# Patient Record
Sex: Female | Born: 1990 | Race: White | Hispanic: No | Marital: Single | Smoking: Former smoker
Health system: Southern US, Community
[De-identification: ages and names within clinical notes are randomized; demographics above are authoritative.]

## PROBLEM LIST (undated history)

## (undated) ENCOUNTER — Inpatient Hospital Stay (HOSPITAL_COMMUNITY): Payer: Self-pay

## (undated) DIAGNOSIS — O364XX0 Maternal care for intrauterine death, not applicable or unspecified: Secondary | ICD-10-CM

## (undated) DIAGNOSIS — K219 Gastro-esophageal reflux disease without esophagitis: Secondary | ICD-10-CM

## (undated) DIAGNOSIS — R519 Headache, unspecified: Secondary | ICD-10-CM

## (undated) DIAGNOSIS — O021 Missed abortion: Secondary | ICD-10-CM

## (undated) DIAGNOSIS — R51 Headache: Secondary | ICD-10-CM

## (undated) HISTORY — PX: DILATION AND CURETTAGE OF UTERUS: SHX78

## (undated) HISTORY — PX: OTHER SURGICAL HISTORY: SHX169

## (undated) HISTORY — PX: WISDOM TOOTH EXTRACTION: SHX21

---

## 2016-08-21 ENCOUNTER — Ambulatory Visit (INDEPENDENT_AMBULATORY_CARE_PROVIDER_SITE_OTHER): Payer: Medicaid Other | Admitting: Family Medicine

## 2016-08-21 ENCOUNTER — Encounter: Payer: Self-pay | Admitting: Family Medicine

## 2016-08-21 ENCOUNTER — Other Ambulatory Visit: Payer: Self-pay | Admitting: Family Medicine

## 2016-08-21 ENCOUNTER — Other Ambulatory Visit (HOSPITAL_COMMUNITY)
Admission: RE | Admit: 2016-08-21 | Discharge: 2016-08-21 | Disposition: A | Discharge status: Home / Self Care | Payer: Medicaid Other | Source: Ambulatory Visit | Attending: Family Medicine | Admitting: Family Medicine

## 2016-08-21 DIAGNOSIS — Z1151 Encounter for screening for human papillomavirus (HPV): Secondary | ICD-10-CM

## 2016-08-21 DIAGNOSIS — Z3491 Encounter for supervision of normal pregnancy, unspecified, first trimester: Secondary | ICD-10-CM | POA: Diagnosis not present

## 2016-08-21 DIAGNOSIS — Z3400 Encounter for supervision of normal first pregnancy, unspecified trimester: Secondary | ICD-10-CM

## 2016-08-21 DIAGNOSIS — Z124 Encounter for screening for malignant neoplasm of cervix: Secondary | ICD-10-CM

## 2016-08-21 DIAGNOSIS — Z3689 Encounter for other specified antenatal screening: Secondary | ICD-10-CM

## 2016-08-21 DIAGNOSIS — Z113 Encounter for screening for infections with a predominantly sexual mode of transmission: Secondary | ICD-10-CM | POA: Diagnosis not present

## 2016-08-21 NOTE — Progress Notes (Signed)
Pt informed that the ultrasound is considered a limited OB ultrasound and is not intended to be a complete ultrasound exam. Patient also informed that the ultrasound is not being completed with the intent of assessing for fetal or placental anomalies or any pelvic abnormalities. Explained that the purpose of today's ultrasound is to assess for viability. Patient acknowledges the purpose of the exam and the limitations of the study.   CRL measures 1.14 cm (7.0 weeks) which is consist with LMP dating. EDC: 04-06-17. Fetal heart rate 160 bpm. Armandina StammerJennifer Danila Eddie RNBSN

## 2016-08-21 NOTE — Patient Instructions (Signed)
    RE: MyChart  Dear Ms. Addison LankRoush  We are excited to introduce MyChart, a new best-in-class service that provides you online access to important information in your electronic medical record. We want to make it easier for you to view your health information - all in one secure location - when and where you need it. We expect MyChart will enhance the quality of care and service we provide. Use the activation code below to enroll in MyChart online at [email or web address]  When you register for MyChart, you can:  Marland Kitchen. View your test results. . Communicate securely with your physician's office.  . View your medical history, allergies, medications, and immunizations. . Conveniently print information such as your medication lists.  If you are age 24 or older and want a member of your family to have access to your record, you must provide written consent by completing a proxy form available at our facility. Please speak to our clinical staff about guidelines regarding accounts for patients younger than age 24.  As you activate your MyChart account and need any technical assistance, please call the MyChart technical support line at [phone]) or email your question to [email or web address]. If you email your question(s), please include your name, a return phone number and the best time to reach you.  Thank you for using MyChart as your new health and wellness resource!  MyChart Activation Code:  [email or web address] Expires: 10/20/2016  3:13 PM           [facility]  [address] [place], [place] [address]

## 2016-08-21 NOTE — Progress Notes (Signed)
  Subjective:    Melanie Harmon is a G4P0030 812w3d being seen today for her first obstetrical visit.  Her obstetrical history is significant for obesity. Also has history of 18 week loss - went into spontaneous labor and abrupted. [facility]. Patient does intend to breast feed. Pregnancy history fully reviewed.  Patient reports no complaints.  Vitals:   08/21/16 1411 08/21/16 1423  BP: 128/73   Pulse: 89   Weight: 278 lb (126.1 kg)   Height:  5\' 8"  (1.727 m)    HISTORY: OB History  Gravida Para Term Preterm AB Living  4 0     3    SAB TAB Ectopic Multiple Live Births  2 1          # Outcome Date GA Lbr Len/2nd Weight Sex Delivery Anes PTL Lv  4 Current           3 SAB           2 SAB           1 TAB              History reviewed. No pertinent past medical history. Past Surgical History:  Procedure Laterality Date  . Dilation and Curretage    . WISDOM TOOTH EXTRACTION     Family History  Problem Relation Age of Onset  . Heart attack Maternal Grandmother   . Heart attack Maternal Grandfather      Exam    Uterus:     Pelvic Exam:    Perineum: No Hemorrhoids, Normal Perineum   Vulva: normal, Bartholin's, Urethra, Skene's normal   Vagina:  normal mucosa   Cervix: no cervical motion tenderness and no lesions   Adnexa: normal adnexa   Bony Pelvis: gynecoid  System:     Skin: normal coloration and turgor, no rashes    Neurologic: gait normal; reflexes normal and symmetric   Extremities: normal strength, tone, and muscle mass   HEENT PERRLA and extra ocular movement intact   Mouth/Teeth mucous membranes moist, pharynx normal without lesions   Neck supple and no masses   Cardiovascular: regular rate and rhythm, no murmurs or gallops   Respiratory:  appears well, vitals normal, no respiratory distress, acyanotic, normal RR, ear and throat exam is normal, neck free of mass or lymphadenopathy, chest clear, no wheezing, crepitations, rhonchi, normal  symmetric air entry   Abdomen: soft, non-tender; bowel sounds normal; no masses,  no organomegaly   Urinary: urethral meatus normal      Assessment:    Pregnancy: G4P0030 There are no active problems to display for this patient.       Plan:     Initial labs drawn. Prenatal vitamins. Problem list reviewed and updated. Genetic Screening discussed First Screen: ordered.  Ultrasound discussed; fetal survey: requested.  Follow up in 4 weeks. 100% of 30 min visit spent on counseling and coordination of care.    Candelaria CelesteSTINSON, Miah Boye JEHIEL 08/21/2016

## 2016-08-22 LAB — HIV ANTIBODY (ROUTINE TESTING W REFLEX): HIV: NONREACTIVE

## 2016-08-23 LAB — OBSTETRIC PANEL
ANTIBODY SCREEN: NEGATIVE
BASOS PCT: 0 %
Basophils Absolute: 0 cells/uL (ref 0–200)
Eosinophils Absolute: 81 cells/uL (ref 15–500)
Eosinophils Relative: 1 %
HCT: 41.5 % (ref 35.0–45.0)
HEMOGLOBIN: 14 g/dL (ref 11.7–15.5)
Hepatitis B Surface Ag: NEGATIVE
LYMPHS ABS: 1863 {cells}/uL (ref 850–3900)
LYMPHS PCT: 23 %
MCH: 29.7 pg (ref 27.0–33.0)
MCHC: 33.7 g/dL (ref 32.0–36.0)
MCV: 87.9 fL (ref 80.0–100.0)
MONOS PCT: 7 %
MPV: 10.1 fL (ref 7.5–12.5)
Monocytes Absolute: 567 cells/uL (ref 200–950)
NEUTROS ABS: 5589 {cells}/uL (ref 1500–7800)
NEUTROS PCT: 69 %
PLATELETS: 251 10*3/uL (ref 140–400)
RBC: 4.72 MIL/uL (ref 3.80–5.10)
RDW: 14.7 % (ref 11.0–15.0)
RUBELLA: 2.92 {index} — AB (ref <0.90)
Rh Type: POSITIVE
WBC: 8.1 10*3/uL (ref 3.8–10.8)

## 2016-08-23 LAB — URINE CULTURE: Organism ID, Bacteria: NO GROWTH

## 2016-08-26 LAB — PAIN MGMT, OPIATES EXP. QN, UR
Codeine: NEGATIVE ng/mL (ref <50)
HYDROCODONE: NEGATIVE ng/mL (ref <50)
HYDROMORPHONE: NEGATIVE ng/mL (ref <50)
Morphine: NEGATIVE ng/mL (ref <50)
NORHYDROCODONE: NEGATIVE ng/mL (ref <50)
NOROXYCODONE: NEGATIVE ng/mL (ref <50)
OXYCODONE: NEGATIVE ng/mL (ref <50)
Oxymorphone: NEGATIVE ng/mL (ref <50)

## 2016-08-28 LAB — GC/CHLAMYDIA PROBE AMP (~~LOC~~) NOT AT ARMC
Chlamydia: NEGATIVE
NEISSERIA GONORRHEA: NEGATIVE

## 2016-09-11 ENCOUNTER — Encounter (HOSPITAL_COMMUNITY): Payer: Self-pay | Admitting: Family Medicine

## 2016-09-18 ENCOUNTER — Ambulatory Visit (INDEPENDENT_AMBULATORY_CARE_PROVIDER_SITE_OTHER): Payer: Medicaid Other | Admitting: Family Medicine

## 2016-09-18 VITALS — BP 112/62 | HR 97 | Wt 281.0 lb

## 2016-09-18 DIAGNOSIS — Z3400 Encounter for supervision of normal first pregnancy, unspecified trimester: Secondary | ICD-10-CM

## 2016-09-18 MED ORDER — PANTOPRAZOLE SODIUM 40 MG PO TBEC: 40.0000 mg | DELAYED_RELEASE_TABLET | Freq: Every day | ORAL | 3 refills | Status: DC

## 2016-09-18 MED ORDER — PANTOPRAZOLE SODIUM 20 MG PO TBEC: 40.0000 mg | DELAYED_RELEASE_TABLET | Freq: Every day | ORAL | Status: DC

## 2016-09-18 NOTE — Addendum Note (Signed)
Addended by: Levie HeritageSTINSON, Kylinn Shropshire J on: 09/18/2016 04:39 PM   Modules accepted: Orders

## 2016-09-18 NOTE — Progress Notes (Signed)
   PRENATAL VISIT NOTE  Subjective:  Melanie Harmon is a 25 y.o. G4P0030 at 5055w3d being seen today for ongoing prenatal care.  She is currently monitored for the following issues for this low-risk pregnancy and has Supervision of normal first pregnancy, antepartum on her problem list.  Patient reports heartburn - improved slightly with Zantac and Tums, but not completely  Contractions: Not present. Vag. Bleeding: None.   . Denies leaking of fluid.   The following portions of the patient's history were reviewed and updated as appropriate: allergies, current medications, past family history, past medical history, past social history, past surgical history and problem list. Problem list updated.  Objective:   Vitals:   09/18/16 1353  BP: 112/62  Pulse: 97  Weight: 281 lb (127.5 kg)    Fetal Status: Fetal Heart Rate (bpm): 150         General:  Alert, oriented and cooperative. Patient is in no acute distress.  Skin: Skin is warm and dry. No rash noted.   Cardiovascular: Normal heart rate noted  Respiratory: Normal respiratory effort, no problems with respiration noted  Abdomen: Soft, gravid, appropriate for gestational age. Pain/Pressure: Absent     Pelvic:  Cervical exam deferred        Extremities: Normal range of motion.  Edema: None  Mental Status: Normal mood and affect. Normal behavior. Normal judgment and thought content.   Assessment and Plan:  Pregnancy: G4P0030 at 5855w3d  1. Supervision of normal first pregnancy, antepartum FHT normal. Protonix for heartburn  Preterm labor symptoms and general obstetric precautions including but not limited to vaginal bleeding, contractions, leaking of fluid and fetal movement were reviewed in detail with the patient. Please refer to After Visit Summary for other counseling recommendations.  No Follow-up on file.   Levie HeritageJacob J Reighlynn Swiney, DO

## 2016-09-24 ENCOUNTER — Inpatient Hospital Stay (HOSPITAL_COMMUNITY): Payer: Medicaid Other

## 2016-09-24 ENCOUNTER — Inpatient Hospital Stay (HOSPITAL_COMMUNITY)
Admission: AD | Admit: 2016-09-24 | Discharge: 2016-09-24 | Disposition: A | Discharge status: Home / Self Care | Payer: Medicaid Other | Source: Ambulatory Visit | Attending: Family Medicine | Admitting: Family Medicine

## 2016-09-24 ENCOUNTER — Encounter (HOSPITAL_COMMUNITY): Payer: Self-pay | Admitting: *Deleted

## 2016-09-24 DIAGNOSIS — O468X1 Other antepartum hemorrhage, first trimester: Secondary | ICD-10-CM

## 2016-09-24 DIAGNOSIS — Z3A12 12 weeks gestation of pregnancy: Secondary | ICD-10-CM | POA: Insufficient documentation

## 2016-09-24 DIAGNOSIS — Z87891 Personal history of nicotine dependence: Secondary | ICD-10-CM | POA: Insufficient documentation

## 2016-09-24 DIAGNOSIS — O208 Other hemorrhage in early pregnancy: Secondary | ICD-10-CM | POA: Insufficient documentation

## 2016-09-24 DIAGNOSIS — O418X10 Other specified disorders of amniotic fluid and membranes, first trimester, not applicable or unspecified: Secondary | ICD-10-CM

## 2016-09-24 DIAGNOSIS — O209 Hemorrhage in early pregnancy, unspecified: Secondary | ICD-10-CM

## 2016-09-24 DIAGNOSIS — N939 Abnormal uterine and vaginal bleeding, unspecified: Secondary | ICD-10-CM | POA: Diagnosis present

## 2016-09-24 LAB — URINALYSIS, ROUTINE W REFLEX MICROSCOPIC
Bilirubin Urine: NEGATIVE
GLUCOSE, UA: NEGATIVE mg/dL
KETONES UR: NEGATIVE mg/dL
Leukocytes, UA: NEGATIVE
NITRITE: NEGATIVE
PROTEIN: NEGATIVE mg/dL
Specific Gravity, Urine: 1.014 (ref 1.005–1.030)
pH: 6 (ref 5.0–8.0)

## 2016-09-24 NOTE — MAU Provider Note (Signed)
History     CSN: [id number]  Arrival date and time: 09/24/16 1145   First Provider Initiated Contact with Patient 09/24/16 1225      Chief Complaint  Patient presents with  . Vaginal Bleeding   HPI Melanie Harmon is a 25 y.o. G4P0030 at 1230w2d who presents with vaginal bleeding. Reports passing a large amount of blood and some clots this morning. Has continued to bleed since then but not as heavy. Reports lower abdominal cramping that she rates 2/10. Has not treated pain. Denies n/v/d, constipation, fever/chills. Had intercourse last night.   OB History    Gravida Para Term Preterm AB Living   4 0     3     SAB TAB Ectopic Multiple Live Births   2 1            Past Medical History:  Diagnosis Date  . Medical history non-contributory     Past Surgical History:  Procedure Laterality Date  . Dilation and Curretage    . WISDOM TOOTH EXTRACTION      Family History  Problem Relation Age of Onset  . Heart attack Maternal Grandmother   . Heart attack Maternal Grandfather     Social History  Substance Use Topics  . Smoking status: Former Games developermoker  . Smokeless tobacco: Former NeurosurgeonUser  . Alcohol use No    Allergies: No Known Allergies  Prescriptions Prior to Admission  Medication Sig Dispense Refill Last Dose  . pantoprazole (PROTONIX) 40 MG tablet Take 1 tablet (40 mg total) by mouth daily. 30 tablet 3 09/23/2016 at Unknown time  . Prenatal Vit-Fe Fumarate-FA (MULTIVITAMIN-PRENATAL) 27-0.8 MG TABS tablet Take 1 tablet by mouth daily at 12 noon.   09/23/2016 at Unknown time    Review of Systems  Constitutional: Negative.   Gastrointestinal: Positive for abdominal pain. Negative for constipation, diarrhea, nausea and vomiting.  Genitourinary:       + vaginal bleeding   Physical Exam   Blood pressure 132/87, pulse 101, temperature 98.2 F (36.8 C), temperature source Oral, resp. rate 18, height 5\' 8"  (1.727 m), weight 281 lb (127.5 kg), last menstrual period  06/30/2016.  Physical Exam  Nursing note and vitals reviewed. Constitutional: She is oriented to person, place, and time. She appears well-developed and well-nourished. No distress.  HENT:  Head: Normocephalic and atraumatic.  Eyes: Conjunctivae are normal. Right eye exhibits no discharge. Left eye exhibits no discharge. No scleral icterus.  Neck: Normal range of motion.  Cardiovascular: Normal rate, regular rhythm and normal heart sounds.   No murmur heard. Respiratory: Effort normal and breath sounds normal. No respiratory distress. She has no wheezes.  GI: Soft. Bowel sounds are normal. She exhibits no distension. There is no tenderness.  Genitourinary: Cervix exhibits no motion tenderness and no friability. There is bleeding (small amount of dark red blood; no active bleeding) in the vagina.  Genitourinary Comments: Cervix closed  Neurological: She is alert and oriented to person, place, and time.  Skin: Skin is warm and dry. She is not diaphoretic.  Psychiatric: She has a normal mood and affect. Her behavior is normal. Judgment and thought content normal.    MAU Course  Procedures Results for orders placed or performed during the hospital encounter of 09/24/16 (from the past 24 hour(s))  Urinalysis, Routine w reflex microscopic     Status: Abnormal   Collection Time: 09/24/16 12:02 PM  Result Value Ref Range   Color, Urine YELLOW YELLOW   APPearance CLEAR CLEAR  Specific Gravity, Urine 1.014 1.005 - 1.030   pH 6.0 5.0 - 8.0   Glucose, UA NEGATIVE NEGATIVE mg/dL   Hgb urine dipstick MODERATE (A) NEGATIVE   Bilirubin Urine NEGATIVE NEGATIVE   Ketones, ur NEGATIVE NEGATIVE mg/dL   Protein, ur NEGATIVE NEGATIVE mg/dL   Nitrite NEGATIVE NEGATIVE   Leukocytes, UA NEGATIVE NEGATIVE   RBC / HPF 0-5 0 - 5 RBC/hpf   WBC, UA 0-5 0 - 5 WBC/hpf   Bacteria, UA RARE (A) NONE SEEN   Squamous Epithelial / LPF 0-5 (A) NONE SEEN   Mucous PRESENT    [place] Ob Comp Less 14 Wks  Result Date:  09/24/2016 CLINICAL DATA:  Heavy vaginal bleeding beginning this morning. Gestational age by LMP of twelve weeks 2 days. EXAM: OBSTETRIC <14 WK ULTRASOUND TECHNIQUE: Transabdominal ultrasound was performed for evaluation of the gestation as well as the maternal uterus and adnexal regions. COMPARISON:  None. FINDINGS: Intrauterine gestational sac: Single Yolk sac:  Not Visualized. Embryo:  Visualized. Cardiac Activity: Visualized. Heart Rate: 152 bpm CRL:   63  mm   12 w 4 d                  US EDC: 04/04/2017 Subchorionic hemorrhage:  Small subchorionic hemorrhage noted. Maternal uterus/adnexae: Both ovaries are normal in appearance. No mass or free fluid identified. IMPRESSION: Single living IUP with estimated gestational age of [redacted] weeks 4 days. This is concordant with LMP. Small subchorionic hemorrhage noted. Electronically Signed   By: Myles RosenthalJohn  Stahl M.D.   On: 09/24/2016 13:15    MDM Rh positive Ultrasound shows SIUP with cardiac activity & small SCH  Assessment and Plan  A: 1. Subchorionic hematoma in first trimester, single or unspecified fetus   2. Vaginal bleeding in pregnancy, first trimester    P: Discharge home Pelvic rest F/u for scheduled appts Discussed reasons to return to  MAU  Judeth HornErin Marylynn Rigdon 09/24/2016, 12:25 PM

## 2016-09-24 NOTE — MAU Note (Signed)
Pt states she woke up & thought she had to go to the BR, found bleeding, was in her underwear & down her legs.  This was @ approximately 1000.  Pt unsure if she is still bleeding, took a shower & came straight to MAU.  Denies pain.

## 2016-09-24 NOTE — Discharge Instructions (Signed)
Subchorionic Hematoma A subchorionic hematoma is a gathering of blood between the outer wall of the placenta and the inner wall of the womb (uterus). The placenta is the organ that connects the fetus to the wall of the uterus. The placenta performs the feeding, breathing (oxygen to the fetus), and waste removal (excretory work) of the fetus.  Subchorionic hematoma is the most common abnormality found on a result from ultrasonography done during the first trimester or early second trimester of pregnancy. If there has been little or no vaginal bleeding, early small hematomas usually shrink on their own and do not affect your baby or pregnancy. The blood is gradually absorbed over 1-2 weeks. When bleeding starts later in pregnancy or the hematoma is larger or occurs in an older pregnant woman, the outcome may not be as good. Larger hematomas may get bigger, which increases the chances for miscarriage. Subchorionic hematoma also increases the risk of premature detachment of the placenta from the uterus, preterm (premature) labor, and stillbirth. HOME CARE INSTRUCTIONS  Stay on bed rest if your health care provider recommends this. Although bed rest will not prevent more bleeding or prevent a miscarriage, your health care provider may recommend bed rest until you are advised otherwise.  Avoid heavy lifting (more than 10 lb [4.5 kg]), exercise, sexual intercourse, or douching as directed by your health care provider.  Keep track of the number of pads you use each day and how soaked (saturated) they are. Write down this information.  Do not use tampons.  Keep all follow-up appointments as directed by your health care provider. Your health care provider may ask you to have follow-up blood tests or ultrasound tests or both. SEEK IMMEDIATE MEDICAL CARE IF:  You have severe cramps in your stomach, back, abdomen, or pelvis.  You have a fever.  You pass large clots or tissue. Save any tissue for your health  care provider to look at.  Your bleeding increases or you become lightheaded, feel weak, or have fainting episodes. This information is not intended to replace advice given to you by your health care provider. Make sure you discuss any questions you have with your health care provider. Document Released: 01/01/2007 Document Revised: 10/07/2014 Document Reviewed: 04/15/2013 Elsevier Interactive Patient Education  2017 Elsevier Inc.         Pelvic Rest Introduction Pelvic rest may be recommended if:  Your placenta is partially or completely covering the opening of your cervix (placenta previa).  There is bleeding between the wall of the uterus and the amniotic sac in the first trimester of pregnancy (subchorionic hemorrhage).  You went into labor too early (preterm labor). Based on your overall health and the health of your baby, your health care provider will decide if pelvic rest is right for you. How do I rest my pelvis? For as long as told by your health care provider:  Do not have sex, sexual stimulation, or an orgasm.  Do not use tampons. Do not douche. Do not put anything in your vagina.  Do not lift anything that is heavier than 10 lb (4.5 kg).  Avoid activities that take a lot of effort (are strenuous).  Avoid any activity in which your pelvic muscles could become strained. When should I seek medical care? Seek medical care if you have:  Cramping pain in your lower abdomen.  Vaginal discharge.  A low, dull backache.  Regular contractions.  Uterine tightening. When should I seek immediate medical care? Seek immediate medical care if:  You have vaginal bleeding and you are pregnant. This information is not intended to replace advice given to you by your health care provider. Make sure you discuss any questions you have with your health care provider. Document Released: 01/11/2011 Document Revised: 02/22/2016 Document Reviewed: 03/20/2015  2017 Elsevier

## 2016-09-26 ENCOUNTER — Other Ambulatory Visit: Payer: Self-pay | Admitting: Family Medicine

## 2016-09-26 ENCOUNTER — Other Ambulatory Visit (HOSPITAL_COMMUNITY): Payer: Self-pay | Admitting: *Deleted

## 2016-09-26 ENCOUNTER — Ambulatory Visit (HOSPITAL_COMMUNITY)
Admission: RE | Admit: 2016-09-26 | Discharge: 2016-09-26 | Disposition: A | Discharge status: Home / Self Care | Payer: Medicaid Other | Source: Ambulatory Visit | Attending: Family Medicine | Admitting: Family Medicine

## 2016-09-26 ENCOUNTER — Encounter (HOSPITAL_COMMUNITY): Payer: Self-pay

## 2016-09-26 DIAGNOSIS — Z3A12 12 weeks gestation of pregnancy: Secondary | ICD-10-CM | POA: Insufficient documentation

## 2016-09-26 DIAGNOSIS — O2622 Pregnancy care for patient with recurrent pregnancy loss, second trimester: Secondary | ICD-10-CM | POA: Diagnosis not present

## 2016-09-26 DIAGNOSIS — O2620 Pregnancy care for patient with recurrent pregnancy loss, unspecified trimester: Secondary | ICD-10-CM

## 2016-09-26 DIAGNOSIS — Z3400 Encounter for supervision of normal first pregnancy, unspecified trimester: Secondary | ICD-10-CM

## 2016-09-26 DIAGNOSIS — Z3682 Encounter for antenatal screening for nuchal translucency: Secondary | ICD-10-CM

## 2016-09-26 DIAGNOSIS — Z3689 Encounter for other specified antenatal screening: Secondary | ICD-10-CM

## 2016-09-26 DIAGNOSIS — O99211 Obesity complicating pregnancy, first trimester: Secondary | ICD-10-CM | POA: Diagnosis not present

## 2016-09-30 NOTE — L&D Delivery Note (Signed)
Obstetrical Delivery Note   Date of Delivery:   11/29/2016 Primary OB:   [facility] Gestational Age/EDD: 1937w5d Antepartum complications: Patient admitted for preterm labor vs cervical insufficiency, BMI 43, history of 18wk delivery  Delivered By:   Dilkon Bingharlie Dirck Butch, Montez HagemanJr.  Delivery Type:   spontaneous vaginal delivery  Delivery Details:   CTSP and patient had delivered placenta and pregnancy (en caul) prior to receiving any medication for augmentation. Newborn still alive and sac ruptured (brown fluid) and infant cord clamped and cut and handed to parents. Placenta inspected and looked grossly normal, non malodorous. Anesthesia:    IV Intrapartum complications: None Laceration:    none Episiotomy:    none Rectal exam:   deferred Placenta:    Delivered spontaneously. Intact: yes. To pathology: yes.  Estimated Blood Loss:  150mL  Baby:    Liveborn female.  Entry from later in the day: weight 400gm. 25.5cm in length. No gross anomalies noted on exam, patent anus.   Cornelia Copaharlie Jacquline Terrill, Jr. MD Attending [facility](Faculty Practice)

## 2016-10-01 ENCOUNTER — Other Ambulatory Visit: Payer: Self-pay | Admitting: Family Medicine

## 2016-10-16 ENCOUNTER — Encounter: Payer: Medicaid Other | Admitting: Family Medicine

## 2016-10-18 ENCOUNTER — Telehealth: Payer: Self-pay

## 2016-10-18 NOTE — Telephone Encounter (Signed)
Attempted patient phone number and disconnected/wrong number. My chart message sent about rescheduled appointment. Armandina StammerJennifer Ruthann Angulo RNBSN

## 2016-10-23 ENCOUNTER — Ambulatory Visit (INDEPENDENT_AMBULATORY_CARE_PROVIDER_SITE_OTHER): Payer: Medicaid Other | Admitting: Family Medicine

## 2016-10-23 VITALS — BP 103/66 | HR 102 | Wt 286.0 lb

## 2016-10-23 DIAGNOSIS — M9901 Segmental and somatic dysfunction of cervical region: Secondary | ICD-10-CM | POA: Diagnosis not present

## 2016-10-23 DIAGNOSIS — O9989 Other specified diseases and conditions complicating pregnancy, childbirth and the puerperium: Secondary | ICD-10-CM

## 2016-10-23 DIAGNOSIS — M9900 Segmental and somatic dysfunction of head region: Secondary | ICD-10-CM

## 2016-10-23 DIAGNOSIS — G44209 Tension-type headache, unspecified, not intractable: Secondary | ICD-10-CM | POA: Diagnosis not present

## 2016-10-23 DIAGNOSIS — M9908 Segmental and somatic dysfunction of rib cage: Secondary | ICD-10-CM

## 2016-10-23 DIAGNOSIS — S0990XS Unspecified injury of head, sequela: Secondary | ICD-10-CM | POA: Insufficient documentation

## 2016-10-23 DIAGNOSIS — Z3402 Encounter for supervision of normal first pregnancy, second trimester: Secondary | ICD-10-CM

## 2016-10-23 DIAGNOSIS — M9902 Segmental and somatic dysfunction of thoracic region: Secondary | ICD-10-CM

## 2016-10-23 DIAGNOSIS — G44309 Post-traumatic headache, unspecified, not intractable: Secondary | ICD-10-CM

## 2016-10-23 DIAGNOSIS — Z3400 Encounter for supervision of normal first pregnancy, unspecified trimester: Secondary | ICD-10-CM

## 2016-10-23 NOTE — Progress Notes (Signed)
Pt is experiencing intense headaches every other day

## 2016-10-23 NOTE — Progress Notes (Signed)
   PRENATAL VISIT NOTE  Subjective:  Melanie Harmon is a 26 y.o. G4P0030 at 2868w3d being seen today for ongoing prenatal care.  She is currently monitored for the following issues for this low-risk pregnancy and has Supervision of normal first pregnancy, antepartum on her problem list.  Patient reports headache - every other day. Tension - band like distribution, lasts most of the day.  Contractions: Not present. Vag. Bleeding: None.  Movement: Present. Denies leaking of fluid.   The following portions of the patient's history were reviewed and updated as appropriate: allergies, current medications, past family history, past medical history, past social history, past surgical history and problem list. Problem list updated.  Objective:   Vitals:   10/23/16 1323  BP: 103/66  Pulse: (!) 102  Weight: 286 lb (129.7 kg)    Fetal Status: Fetal Heart Rate (bpm): 148   Movement: Present     General:  Alert, oriented and cooperative. Patient is in no acute distress.  Skin: Skin is warm and dry. No rash noted.   Cardiovascular: Normal heart rate noted  Respiratory: Normal respiratory effort, no problems with respiration noted  Abdomen: Soft, gravid, appropriate for gestational age. Pain/Pressure: Absent     Pelvic:  Cervical exam deferred        MSK: Restriction, tenderness, tissue texture changes, and paraspinal spasm in the cervical spine  Neuro: Moves all four extremities with no focal neurological deficit  Extremities: Normal range of motion.  Edema: None  Mental Status: Normal mood and affect. Normal behavior. Normal judgment and thought content.   OSE: Head OA SRRL  Cervical C3 FSRL  Thoracic T4-5  FSRR  Rib T4-5 inhaled right  Lumbar   Sacrum   Pelvis     Assessment and Plan:  Pregnancy: G4P0030 at 8068w3d  1. Supervision of normal first pregnancy, antepartum FH and FHT normal  2. Tension headache Flexeril prescribed. 3. Somatic dysfunction of head region 4. Somatic  dysfunction of cervical region 5. Somatic dysfunction of thoracic region 6. Somatic dysfunction of rib cage region OMT done after pt permission. Tolerated well. HVLA, ME utilized.    Preterm labor symptoms and general obstetric precautions including but not limited to vaginal bleeding, contractions, leaking of fluid and fetal movement were reviewed in detail with the patient. Please refer to After Visit Summary for other counseling recommendations.  No Follow-up on file.  Levie HeritageJacob J Stinson, DO

## 2016-11-13 ENCOUNTER — Ambulatory Visit (HOSPITAL_COMMUNITY)
Admission: RE | Admit: 2016-11-13 | Discharge: 2016-11-13 | Disposition: A | Discharge status: Home / Self Care | Payer: Medicaid Other | Source: Ambulatory Visit | Attending: Family Medicine | Admitting: Family Medicine

## 2016-11-13 DIAGNOSIS — O09292 Supervision of pregnancy with other poor reproductive or obstetric history, second trimester: Secondary | ICD-10-CM | POA: Diagnosis not present

## 2016-11-13 DIAGNOSIS — Z3A19 19 weeks gestation of pregnancy: Secondary | ICD-10-CM | POA: Insufficient documentation

## 2016-11-13 DIAGNOSIS — Z363 Encounter for antenatal screening for malformations: Secondary | ICD-10-CM | POA: Diagnosis not present

## 2016-11-13 DIAGNOSIS — O99212 Obesity complicating pregnancy, second trimester: Secondary | ICD-10-CM | POA: Diagnosis not present

## 2016-11-13 DIAGNOSIS — Z3689 Encounter for other specified antenatal screening: Secondary | ICD-10-CM

## 2016-11-18 ENCOUNTER — Encounter: Payer: Medicaid Other | Admitting: Family Medicine

## 2016-11-25 ENCOUNTER — Inpatient Hospital Stay (HOSPITAL_COMMUNITY): Payer: Medicaid Other

## 2016-11-25 ENCOUNTER — Encounter (HOSPITAL_COMMUNITY): Payer: Self-pay

## 2016-11-25 ENCOUNTER — Inpatient Hospital Stay (HOSPITAL_COMMUNITY)
Admission: AD | Admit: 2016-11-25 | Discharge: 2016-11-29 | DRG: 775 | Disposition: A | Discharge status: Home / Self Care | Payer: Medicaid Other | Source: Ambulatory Visit | Attending: Obstetrics and Gynecology | Admitting: Obstetrics and Gynecology

## 2016-11-25 DIAGNOSIS — Z87891 Personal history of nicotine dependence: Secondary | ICD-10-CM | POA: Diagnosis not present

## 2016-11-25 DIAGNOSIS — O3432 Maternal care for cervical incompetence, second trimester: Secondary | ICD-10-CM | POA: Diagnosis present

## 2016-11-25 DIAGNOSIS — O42112 Preterm premature rupture of membranes, onset of labor more than 24 hours following rupture, second trimester: Secondary | ICD-10-CM | POA: Diagnosis not present

## 2016-11-25 DIAGNOSIS — O42912 Preterm premature rupture of membranes, unspecified as to length of time between rupture and onset of labor, second trimester: Secondary | ICD-10-CM | POA: Diagnosis not present

## 2016-11-25 DIAGNOSIS — O4690 Antepartum hemorrhage, unspecified, unspecified trimester: Secondary | ICD-10-CM

## 2016-11-25 DIAGNOSIS — Z3A21 21 weeks gestation of pregnancy: Secondary | ICD-10-CM | POA: Diagnosis not present

## 2016-11-25 DIAGNOSIS — O09299 Supervision of pregnancy with other poor reproductive or obstetric history, unspecified trimester: Secondary | ICD-10-CM | POA: Diagnosis present

## 2016-11-25 LAB — TYPE AND SCREEN
ABO/RH(D): O POS
Antibody Screen: NEGATIVE

## 2016-11-25 MED ORDER — DOCUSATE SODIUM 100 MG PO CAPS
100.0000 mg | ORAL_CAPSULE | Freq: Every day | ORAL | Status: DC
  Administered 2016-11-26 – 2016-11-28 (×3): 100 mg via ORAL
  Filled 2016-11-25 (×5): qty 1

## 2016-11-25 MED ORDER — ACETAMINOPHEN 325 MG PO TABS
650.0000 mg | ORAL_TABLET | ORAL | Status: DC | PRN
  Administered 2016-11-25 – 2016-11-29 (×5): 650 mg via ORAL
  Filled 2016-11-25 (×5): qty 2

## 2016-11-25 MED ORDER — CALCIUM CARBONATE ANTACID 500 MG PO CHEW: 2.0000 | CHEWABLE_TABLET | ORAL | Status: DC | PRN

## 2016-11-25 MED ORDER — PRENATAL MULTIVITAMIN CH
1.0000 | ORAL_TABLET | Freq: Every day | ORAL | Status: DC
  Administered 2016-11-26 – 2016-11-28 (×3): 1 via ORAL
  Filled 2016-11-25 (×4): qty 1

## 2016-11-25 NOTE — H&P (Signed)
Melanie Harmon is a 26 y.o. female presenting for Feeling something bulging.  .First Provider Initiated Contact with Patient 11/25/16 2103     HPI: Melanie Harmon is a 26 y.o. G4P0030 at 3w1dwho presents to maternity admissions reporting pressure in pelvis, felt like something was bulging.  States has had to urinate frequently today.  Upon entering room, started having vaginal bleeding.. She reports good fetal movement, denies LOF, vaginal itching/burning, urinary symptoms, h/a, dizziness, n/v, diarrhea, constipation or fever/chills.    History is remarkable for an 18 week abruption and loss (pt states was 21 weeks but told Dr Adrian Blackwater it was 18 weeks).  Delivered at [facility].   Patient states Dr Shawnie Pons told her it was an abruption, and the other doctor told her it was incompetent cervix.  States went to ED, sat in waiting room x 5 hrs, then was found to be 6cm.   RN checked her and she ruptured and delivered.  States each doctor told her different diagnosis and no one talked about cerclage.   Records from office reviewed, there is no mention of presentation for delivery.  Hospital records are not included, thus unable to review.  Vaginal Bleeding  The patient's primary symptoms include vaginal bleeding. The patient's pertinent negatives include no genital itching, genital lesions, genital odor or pelvic pain. This is a new problem. The current episode started today. The problem occurs constantly. The problem has been gradually worsening. The patient is experiencing no pain. She is pregnant. Associated symptoms include frequency. Pertinent negatives include no abdominal pain, back pain, chills, constipation, diarrhea, dysuria, fever, flank pain, nausea or vomiting. The vaginal discharge was bloody. The vaginal bleeding is heavier than menses. She has not been passing clots. She has not been passing tissue. She has tried nothing for the symptoms.   RN Note: Pt presents stating she feels like something is coming out  of her vagina. States she went to the bathroom and wiped and states she felt something there. Denies bleeding or leaking. Denies pain.   OB History    Gravida Para Term Preterm AB Living   4 0     3 0   SAB TAB Ectopic Multiple Live Births   2 1           Past Medical History:  Diagnosis Date  . Medical history non-contributory    Past Surgical History:  Procedure Laterality Date  . DILATION AND CURETTAGE OF UTERUS    . Dilation and Curretage    . WISDOM TOOTH EXTRACTION     Family History: family history includes Heart attack in her maternal grandfather and maternal grandmother. Social History:  reports that she has quit smoking. She has quit using smokeless tobacco. She reports that she does not drink alcohol or use drugs.     Maternal Diabetes: No Genetic Screening: Normal Maternal Ultrasounds/Referrals: Normal Fetal Ultrasounds or other Referrals:  None Maternal Substance Abuse:  No Significant Maternal Medications:  None Significant Maternal Lab Results:  None Other Comments:  Blood type O+  Review of Systems  Constitutional: Negative for chills, fever and malaise/fatigue.  Respiratory: Negative for shortness of breath.   Cardiovascular: Negative for leg swelling.  Gastrointestinal: Negative for abdominal pain, constipation, diarrhea, nausea and vomiting.  Genitourinary: Positive for frequency. Negative for dysuria.  Musculoskeletal: Negative for back pain and neck pain.  Neurological: Negative for dizziness and focal weakness.   Maternal Medical History:  Reason for admission: Nausea. Incompetent cervix Bulging membranes  Contractions: Onset was 1-2  hours ago.   Frequency: rare.    Fetal activity: Perceived fetal activity is normal.   Last perceived fetal movement was within the past hour.    Prenatal complications: Bleeding.   No PIH, placental abnormality, pre-eclampsia or preterm labor.   Prenatal Complications - Diabetes: none.      Blood  pressure 117/66, pulse (!) 122, temperature 98.9 F (37.2 C), temperature source Oral, resp. rate 20, height 5\' 8"  (1.727 m), weight 286 lb (129.7 kg), last menstrual period 06/30/2016, SpO2 99 %. Maternal Exam:  Uterine Assessment: Contraction frequency is rare.   Abdomen: Patient reports no abdominal tenderness. Fundal height is 21.   Fetal presentation: no presenting part  Introitus: Normal vulva. Normal vagina.  Ferning test: negative.  Nitrazine test: not done.  Cervix: Cervix evaluated by sterile speculum exam.     Fetal Exam Fetal Monitor Review: Mode: ultrasound.   Baseline rate: 150s.      Physical Exam  Constitutional: She is oriented to person, place, and time. She appears well-developed and well-nourished. No distress.  HENT:  Head: Normocephalic.  Cardiovascular: Normal rate and regular rhythm.   Respiratory: Effort normal and breath sounds normal. No respiratory distress.  GI: Soft. She exhibits no distension. There is no tenderness. There is no rebound and no guarding.  Genitourinary:  Genitourinary Comments: Sterile speculum exam Vagina filled with blood Membranes visible at external os which appears to be about 5cm dilated and fully effaced Negative ferning  Musculoskeletal: Normal range of motion.  Neurological: She is alert and oriented to person, place, and time.  Skin: Skin is warm and dry.  Psychiatric: She has a normal mood and affect.    Prenatal labs: ABO, Rh: O/POS/-- (11/22 1514) Antibody: NEG (11/22 1514) Rubella: 2.92 (11/22 1514) RPR: NON REAC (11/22 1514)  HBsAg: NEGATIVE (11/22 1514)  HIV: NONREACTIVE (11/22 1514)  GBS:     Assessment/Plan: [facility]UP at 7633w1d Incompetent cervix Bulging membranes, no evidence of PPROM Vaginal bleeding  Consulted Dr Jolayne Pantheronstant Admit to Antenatal for expectant management Discussed situation with parents Discussed we may discharge her home until viability.  MD to follow   Wynelle BourgeoisMarie Sarajean Dessert 11/25/2016, 9:32  PM

## 2016-11-25 NOTE — MAU Note (Signed)
Pt presents stating she feels like something is coming out of her vagina. States she went to the bathroom and wiped and states she felt something there. Denies bleeding or leaking. Denies pain.

## 2016-11-26 DIAGNOSIS — O3432 Maternal care for cervical incompetence, second trimester: Principal | ICD-10-CM

## 2016-11-26 LAB — CBC
HCT: 34.9 % — ABNORMAL LOW (ref 36.0–46.0)
Hemoglobin: 11.9 g/dL — ABNORMAL LOW (ref 12.0–15.0)
MCH: 30.5 pg (ref 26.0–34.0)
MCHC: 34.1 g/dL (ref 30.0–36.0)
MCV: 89.5 fL (ref 78.0–100.0)
PLATELETS: 185 10*3/uL (ref 150–400)
RBC: 3.9 MIL/uL (ref 3.87–5.11)
RDW: 12.9 % (ref 11.5–15.5)
WBC: 11.7 10*3/uL — AB (ref 4.0–10.5)

## 2016-11-26 LAB — ABO/RH: ABO/RH(D): O POS

## 2016-11-26 MED ORDER — INDOMETHACIN 50 MG PO CAPS
50.0000 mg | ORAL_CAPSULE | Freq: Three times a day (TID) | ORAL | Status: AC
  Administered 2016-11-26 – 2016-11-28 (×9): 50 mg via ORAL
  Filled 2016-11-26 (×9): qty 1

## 2016-11-26 MED ORDER — NIFEDIPINE ER OSMOTIC RELEASE 30 MG PO TB24
30.0000 mg | ORAL_TABLET | Freq: Two times a day (BID) | ORAL | Status: DC
  Administered 2016-11-26 – 2016-11-28 (×7): 30 mg via ORAL
  Filled 2016-11-26 (×7): qty 1

## 2016-11-26 MED ORDER — INDOMETHACIN 50 MG PO CAPS: 50.0000 mg | ORAL_CAPSULE | Freq: Three times a day (TID) | ORAL | Status: DC

## 2016-11-26 NOTE — Progress Notes (Signed)
Patient ID: Melanie ShaveZari Dady, female   DOB: 1991-05-07, 26 y.o.   MRN: [id number] FACULTY PRACTICE ANTEPARTUM(COMPREHENSIVE) NOTE  Melanie Harmon is a 26 y.o. G4P0030 at 5869w2d  who is admitted for incompetent cervix, preterm labor with bulging bag.    Fetal presentation is unsure. Length of Stay:  1  Days  Date of admission:11/25/2016  Subjective: Patient reports intermittent contractions Patient reports the fetal movement as active. Patient reports uterine contraction  activity as irregular, every 5-7 minutes. Patient reports  vaginal bleeding as none. Patient describes fluid per vagina as None.  Vitals:  Blood pressure (!) 129/55, pulse 100, temperature 98.7 F (37.1 C), temperature source Oral, resp. rate 20, height 5\' 8"  (1.727 m), weight 275 lb (124.7 kg), last menstrual period 06/30/2016, SpO2 99 %. Vitals:   11/25/16 2054 11/25/16 2254  BP: 117/66 (!) 129/55  Pulse: (!) 122 100  Resp: 20 20  Temp: 98.9 F (37.2 C) 98.7 F (37.1 C)  TempSrc: Oral Oral  SpO2: 99%   Weight: 286 lb (129.7 kg) 275 lb (124.7 kg)  Height: 5\' 8"  (1.727 m) 5\' 8"  (1.727 m)   Physical Examination:  General appearance - alert, well appearing, and in no distress Fundal Height:  size equals dates Pelvic Exam:  Not performed Cervical Exam: not performed Extremities: extremities normal, atraumatic, no cyanosis or edema with DTRs 2+ on the bilateral Membranes:intact  Fetal Monitoring:  Positive heart tones  Labs:  Results for orders placed or performed during the hospital encounter of 11/25/16 (from the past 24 hour(s))  Type and screen [facility]   Collection Time: 11/25/16 10:07 PM  Result Value Ref Range   ABO/RH(D) O POS    Antibody Screen NEG    Sample Expiration 11/28/2016   ABO/Rh   Collection Time: 11/25/16 10:07 PM  Result Value Ref Range   ABO/RH(D) O POS     Imaging Studies:      Medications:  Scheduled . docusate sodium  100 mg Oral Daily  . indomethacin  50 mg Oral  TID WC  . NIFEdipine  30 mg Oral BID  . prenatal multivitamin  1 tablet Oral Q1200   I have reviewed the patient's current medications.  ASSESSMENT: G4P0030 3069w2d Estimated Date of Delivery: 04/06/17  Patient Active Problem List   Diagnosis Date Noted  . Incompetent cervix during second trimester, antepartum 11/25/2016  . Headaches due to old head injury 10/23/2016  . Supervision of normal first pregnancy, antepartum 08/21/2016    PLAN: 1) Continue trendelenburg 2) tocolysis with procardia and now indocin added this am 3) Discussed limited intervention at this gestational age. The goal is to try to get her to stop contracting. Patient and her husband verbalized understanding and all questions were answered 4) Continue current care 5) cbc ordered   Denya Buckingham 11/26/2016,7:47 AM

## 2016-11-26 NOTE — Progress Notes (Signed)
I offered initial support to Melanie Harmon who arrived early this morning.  She experienced a 18 week loss in 2017 and reports that she and her husband have been weary of becoming too attached to this baby because of their fear of losing her.  I normalized this experience that many grieving parents have.  Melanie Harmon became tearful when talking about her baby, Melanie Harmon (not sure of spelling), but then requested that we not have that conversation at this time. I affirmed her feeling of not wanting to talk and let her know of our availability to talk, bring art supplies or other things to help her through this period of waiting.  Chaplain Dyanne CarrelKaty Justyn Langham, Bcc Pager, [phone] 12:38 PM    11/26/16 1200  Clinical Encounter Type  Visited With Patient  Visit Type Initial;Spiritual support  Referral From Nurse  Spiritual Encounters  Spiritual Needs Emotional

## 2016-11-26 NOTE — Progress Notes (Signed)
Notified provider that patient c/o of having a pressure feeling and Patient continues to have a small amount of bleeding. Per provider continue to keep patient in trendelenburg and defer vaginal exam

## 2016-11-27 MED ORDER — PANTOPRAZOLE SODIUM 40 MG PO TBEC
40.0000 mg | DELAYED_RELEASE_TABLET | Freq: Every day | ORAL | Status: DC
  Administered 2016-11-27 – 2016-11-28 (×2): 40 mg via ORAL
  Filled 2016-11-27 (×2): qty 1

## 2016-11-27 NOTE — Progress Notes (Signed)
FACULTY PRACTICE ANTEPARTUM(COMPREHENSIVE) NOTE  Melanie Harmon is a 26 y.o. G4P0030 at 6443w3d by best clinical estimate who is admitted for incompetent cervix.   Fetal presentation is transverse. Length of Stay:  2  Days  Subjective: Feels less pressure Patient reports the fetal movement as active. Patient reports uterine contraction  activity as none. Patient reports  vaginal bleeding as scant staining. Patient describes fluid per vagina as None.  Vitals:  Blood pressure (!) 90/47, pulse 94, temperature 98.6 F (37 C), temperature source Oral, resp. rate 16, height 5\' 8"  (1.727 m), weight 275 lb (124.7 kg), last menstrual period 06/30/2016, SpO2 100 %. Physical Examination:  General appearance - alert, well appearing, and in no distress Chest - normal effort Abdomen - gravid, NT Pelvic - Normal EFG, just inside introitus BBOW palpated. No fetal parts noted. Fundal Height:  size less than dates Extremities: Homans sign is negative, no sign of DVT  Membranes:intact   Medications:  Scheduled . docusate sodium  100 mg Oral Daily  . indomethacin  50 mg Oral TID WC  . NIFEdipine  30 mg Oral BID  . pantoprazole  40 mg Oral Daily  . prenatal multivitamin  1 tablet Oral Q1200   I have reviewed the patient's current medications.  ASSESSMENT: Active Problems:   Incompetent cervix during second trimester, antepartum   PLAN: Plan to continue to monitor x 1 more day then re-assess. She is understanding of limitations of this gestational age. Discussed plan for future pregnancy to include 17 P and cerclage. She is reluctant to go home, because she lives so far away, but understands we may not be able to keep her.  Reva Boresanya S Pratt, MD 11/27/2016,3:17 PM

## 2016-11-28 ENCOUNTER — Encounter: Payer: Medicaid Other | Admitting: Family Medicine

## 2016-11-28 ENCOUNTER — Encounter: Payer: Self-pay | Admitting: Family Medicine

## 2016-11-29 ENCOUNTER — Encounter (HOSPITAL_COMMUNITY): Payer: Self-pay | Admitting: Obstetrics and Gynecology

## 2016-11-29 DIAGNOSIS — O42112 Preterm premature rupture of membranes, onset of labor more than 24 hours following rupture, second trimester: Secondary | ICD-10-CM

## 2016-11-29 LAB — CBC WITH DIFFERENTIAL/PLATELET
BASOS ABS: 0 10*3/uL (ref 0.0–0.1)
BASOS PCT: 0 %
EOS ABS: 0.1 10*3/uL (ref 0.0–0.7)
EOS PCT: 1 %
HCT: 35.1 % — ABNORMAL LOW (ref 36.0–46.0)
Hemoglobin: 12.2 g/dL (ref 12.0–15.0)
Lymphocytes Relative: 16 %
Lymphs Abs: 1.8 10*3/uL (ref 0.7–4.0)
MCH: 30.3 pg (ref 26.0–34.0)
MCHC: 34.8 g/dL (ref 30.0–36.0)
MCV: 87.3 fL (ref 78.0–100.0)
MONO ABS: 0.5 10*3/uL (ref 0.1–1.0)
Monocytes Relative: 5 %
Neutro Abs: 8.4 10*3/uL — ABNORMAL HIGH (ref 1.7–7.7)
Neutrophils Relative %: 78 %
PLATELETS: 213 10*3/uL (ref 150–400)
RBC: 4.02 MIL/uL (ref 3.87–5.11)
RDW: 12.9 % (ref 11.5–15.5)
WBC: 10.8 10*3/uL — ABNORMAL HIGH (ref 4.0–10.5)

## 2016-11-29 LAB — TYPE AND SCREEN
ABO/RH(D): O POS
ANTIBODY SCREEN: NEGATIVE

## 2016-11-29 MED ORDER — OXYTOCIN 40 UNITS IN LACTATED RINGERS INFUSION - SIMPLE MED
INTRAVENOUS | Status: AC
  Filled 2016-11-29: qty 1000

## 2016-11-29 MED ORDER — FENTANYL CITRATE (PF) 100 MCG/2ML IJ SOLN
50.0000 ug | INTRAMUSCULAR | Status: DC | PRN
  Administered 2016-11-29: 50 ug via INTRAVENOUS
  Filled 2016-11-29: qty 2

## 2016-11-29 MED ORDER — ZOLPIDEM TARTRATE 5 MG PO TABS: 5.0000 mg | ORAL_TABLET | Freq: Every evening | ORAL | 1 refills | Status: DC | PRN

## 2016-11-29 MED ORDER — OXYTOCIN 40 UNITS IN LACTATED RINGERS INFUSION - SIMPLE MED: 1.0000 m[IU]/min | INTRAVENOUS | Status: DC

## 2016-11-29 MED ORDER — IBUPROFEN 600 MG PO TABS: 600.0000 mg | ORAL_TABLET | Freq: Four times a day (QID) | ORAL | 0 refills | Status: DC | PRN

## 2016-11-29 MED ORDER — MISOPROSTOL 200 MCG PO TABS: 400.0000 ug | ORAL_TABLET | ORAL | Status: DC

## 2016-11-29 NOTE — Progress Notes (Signed)
Terrah and her husband declined speaking at this time (per Charity fundraiserN).  I offered grief support to pt's mother, Alona BeneJoyce who was visiting from [place].  [place].  I offered listening presence to Alona BeneJoyce and gave her my cards for follow up support to pass on to her children if they want to talk at any point.  Chaplain Dyanne CarrelKaty Kinnley Paulson, Bcc Pager, [phone] 2:20 PM    11/29/16 1400  Clinical Encounter Type  Visited With Family;Health care provider  Visit Type Spiritual support  Spiritual Encounters  Spiritual Needs Grief support  Stress Factors  Patient Stress Factors Loss  Family Stress Factors Loss

## 2016-11-29 NOTE — Progress Notes (Addendum)
GYN Note  Having more pain and pressure and fetal parts (still intact BOW) feels lower and patient very uncomfortable with stable small amount of bleeding/bloody show. They would like to proceed with augmentin for inevitable AB and likely chorio. D/w her that she can have epidural but she'd like to do IV pain meds. After this is given will come by and place cytotec. Okay to stay on 3rd floor if stays stable. D/w them risk of needing d&c for rPOCs. They would like to see the child after delivery.   Cornelia Copaharlie Micaila Ziemba, Jr MD Attending [facility] 11/29/2016 Time: 1043am

## 2016-11-29 NOTE — Progress Notes (Addendum)
GYN Note  CTSP re: increased VB, LOF and pain and pressure.   Gen: in Trendelenberg, appears anxious AF VS normal and stable Abd: NTTP SVE: fetal parts (likely footling) and BOW (intact palpable to +2 to +3). No cervix palpated Blood on EGBUS and approx 25mL on the pad.  Bedside u/s: breech, normal FHR and subjectively normal fluid  O POS CBC Latest Ref Rng & Units 11/29/2016 11/26/2016 08/21/2016  WBC 4.0 - 10.5 K/uL 10.8(H) 11.7(H) 8.1  Hemoglobin 12.0 - 15.0 g/dL 16.112.2 11.9(L) 14.0  Hematocrit 36.0 - 46.0 % 35.1(L) 34.9(L) 41.5  Platelets 150 - 400 K/uL 213 185 251   A/P: likely PTL  D/w pt and family re: next steps and that she definitely has to stay no matter what is decided. Also, d/w them re: fetus is at least two weeks too early to consider fetal intervention and all is for maternal benefit.  I told them that this is consistent with inevitable AB and I recommend stopping the tocolytics (indocin and procardia) and augmenting with medications for delivery, given consistent with inevitable AB and with increased bleeding, risk of more bleeding; also, she has an elevated WBC and left shift and concern for chorio. Other option I told her is to continue with current clinical course and see what happens. Will follow up with them and see what they decide  30min spent on patient care and >50% in face to face discussion with the patient.    Cornelia Copaharlie Torris House, Jr MD [facility]ending [facility] 11/29/2016 Time: [phone]

## 2016-11-29 NOTE — Discharge Instructions (Signed)
Vaginal Delivery, Care After Refer to this sheet in the next few weeks. These discharge instructions provide you with information on caring for yourself after delivery. Your caregiver may also give you specific instructions. Your treatment has been planned according to the most current medical practices available, but problems sometimes occur. Call your caregiver if you have any problems or questions after you go home. HOME CARE INSTRUCTIONS 1. Take over-the-counter or prescription medicines only as directed by your caregiver or pharmacist. 2. Do not drink alcohol, especially if you are breastfeeding or taking medicine to relieve pain. 3. Do not smoke tobacco. 4. Continue to use good perineal care. Good perineal care includes: 1. Wiping your perineum from back to front 2. Keeping your perineum clean. 3. You can do sitz baths twice a day, to help keep this area clean 5. Do not use tampons, douche or have sex until your caregiver says it is okay. 6. Shower only and avoid sitting in submerged water, aside from sitz baths 7. Wear a well-fitting bra that provides breast support. 8. Eat healthy foods. 9. Drink enough fluids to keep your urine clear or pale yellow. 10. Eat high-fiber foods such as whole grain cereals and breads, brown rice, beans, and fresh fruits and vegetables every day. These foods may help prevent or relieve constipation. 11. Avoid constipation with high fiber foods or medications, such as miralax or metamucil 12. Follow your caregiver's recommendations regarding resumption of activities such as climbing stairs, driving, lifting, exercising, or traveling. 13. Talk to your caregiver about resuming sexual activities. Resumption of sexual activities is dependent upon your risk of infection, your rate of healing, and your comfort and desire to resume sexual activity. 14. Try to have someone help you with your household activities and your newborn for at least a few days after you leave  the hospital. 15. Rest as much as possible. Try to rest or take a nap when your newborn is sleeping. 16. Increase your activities gradually. 17. Keep all of your scheduled postpartum appointments. It is very important to keep your scheduled follow-up appointments. At these appointments, your caregiver will be checking to make sure that you are healing physically and emotionally. SEEK MEDICAL CARE IF:   You are passing large clots from your vagina. Save any clots to show your caregiver.  You have a foul smelling discharge from your vagina.  You have trouble urinating.  You are urinating frequently.  You have pain when you urinate.  You have a change in your bowel movements.  You have increasing redness, pain, or swelling near your vaginal incision (episiotomy) or vaginal tear.  You have pus draining from your episiotomy or vaginal tear.  Your episiotomy or vaginal tear is separating.  You have painful, hard, or reddened breasts.  You have a severe headache.  You have blurred vision or see spots.  You feel sad or depressed.  You have thoughts of hurting yourself or your newborn.  You have questions about your care, the care of your newborn, or medicines.  You are dizzy or light-headed.  You have a rash.  You have nausea or vomiting.  You were breastfeeding and have not had a menstrual period within 12 weeks after you stopped breastfeeding.  You are not breastfeeding and have not had a menstrual period by the 12th week after delivery.  You have a fever. SEEK IMMEDIATE MEDICAL CARE IF:   You have persistent pain.  You have chest pain.  You have shortness of breath.    You faint.  You have leg pain.  You have stomach pain.  Your vaginal bleeding saturates two or more sanitary pads in 1 hour. MAKE SURE YOU:   Understand these instructions.  Will watch your condition.  Will get help right away if you are not doing well or get worse. Document Released:  09/13/2000 Document Revised: 01/31/2014 Document Reviewed: 05/13/2012 ExitCare Patient Information 2015 [employer]. This information is not intended to replace advice given to you by your health care provider. Make sure you discuss any questions you have with your health care provider.  Sitz Bath A sitz bath is a warm water bath taken in the sitting position. The water covers only the hips and butt (buttocks). We recommend using one that fits in the toilet, to help with ease of use and cleanliness. It may be used for either healing or cleaning purposes. Sitz baths are also used to relieve pain, itching, or muscle tightening (spasms). The water may contain medicine. Moist heat will help you heal and relax.  HOME CARE  Take 3 to 4 sitz baths a day. 18. Fill the bathtub half-full with warm water. 19. Sit in the water and open the drain a little. 20. Turn on the warm water to keep the tub half-full. Keep the water running constantly. 21. Soak in the water for 15 to 20 minutes. 22. After the sitz bath, pat the affected area dry. GET HELP RIGHT AWAY IF: You get worse instead of better. Stop the sitz baths if you get worse. MAKE SURE YOU:  Understand these instructions.  Will watch your condition.  Will get help right away if you are not doing well or get worse. Document Released: 10/24/2004 Document Revised: 06/10/2012 Document Reviewed: 01/14/2011 ExitCare Patient Information 2015 [employer]. This information is not intended to replace advice given to you by your health care provider. Make sure you discuss any questions you have with your health care provider.    

## 2016-11-29 NOTE — Discharge Summary (Signed)
Discharge Summary   Admit Date: 11/25/2016 Discharge Date: 11/29/2016 Discharging Service: Antepartum  [facility] Admitting Physician: Catalina AntiguaPeggy Constant, MD  [facility]  Primary Care Provider: No primary care provider on file.  Admission Diagnoses: *Pregnancy at 21/1 weeks *Preterm labor vs cervical insufficiency *BMI 43 *History of 18 week delivery  Discharge Diagnoses: *Status post SVD at 21/5 weeks *Preterm labor vs cervical insufficiency *BMI 43 *History of 18 week delivery  Consult Orders: None   Surgeries/Procedures Performed: None  History and Physical: Cosigned by: Catalina AntiguaPeggy Constant, MD at 11/26/2016 1:08 AM  Attestation signed by Catalina AntiguaPeggy Constant, MD at 11/26/2016 1:08 AM  Attestation of Attending Supervision of Advanced Practitioner (CNM/NP): Evaluation and management procedures were performed by the Advanced Practitioner under my supervision and collaboration.  I have reviewed the Advanced Practitioner's note and chart, and I agree with the management and plan.  Peggy Constant 11/26/2016 1:08 AM        [] Hide copied text Melanie Harmon is a 26 y.o. female presenting for Feeling something bulging.  .First Provider Initiated Contact with Patient 11/25/16 2103     HPI: Melanie ShaveZari Bedoya is a 26 y.o. G4P0030 at 5121w1dwho presents to maternity admissions reporting pressure in pelvis, felt like something was bulging.  States has had to urinate frequently today.  Upon entering room, started having vaginal bleeding.. She reports good fetal movement, denies LOF, vaginal itching/burning, urinary symptoms, h/a, dizziness, n/v, diarrhea, constipation or fever/chills.    History is remarkable for an 18 week abruption and loss (pt states was 21 weeks but told Dr Adrian BlackwaterStinson it was 18 weeks).  [facility]   Patient states Dr Shawnie Ponsorn told her it was an abruption, and the other doctor told her it was incompetent cervix.  States  went to ED, sat in waiting room x 5 hrs, then was found to be 6cm.   RN checked her and she ruptured and delivered.  States each doctor told her different diagnosis and no one talked about cerclage.   Records from office reviewed, there is no mention of presentation for delivery.  Hospital records are not included, thus unable to review.  Vaginal Bleeding  The patient's primary symptoms include vaginal bleeding. The patient's pertinent negatives include no genital itching, genital lesions, genital odor or pelvic pain. This is a new problem. The current episode started today. The problem occurs constantly. The problem has been gradually worsening. The patient is experiencing no pain. She is pregnant. Associated symptoms include frequency. Pertinent negatives include no abdominal pain, back pain, chills, constipation, diarrhea, dysuria, fever, flank pain, nausea or vomiting. The vaginal discharge was bloody. The vaginal bleeding is heavier than menses. She has not been passing clots. She has not been passing tissue. She has tried nothing for the symptoms.   RN Note: Pt presents stating she feels like something is coming out of her vagina. States she went to the bathroom and wiped and states she felt something there. Denies bleeding or leaking. Denies pain.           OB History    Gravida Para Term Preterm AB Living   4 0     3 0   SAB TAB Ectopic Multiple Live Births   2 1               Past Medical History:  Diagnosis Date  . Medical history non-contributory         Past Surgical History:  Procedure Laterality Date  .  DILATION AND CURETTAGE OF UTERUS    . Dilation and Curretage    . WISDOM TOOTH EXTRACTION     Family History: family history includes Heart attack in her maternal grandfather and maternal grandmother. Social History:  reports that she has quit smoking. She has quit using smokeless tobacco. She reports that she does not drink alcohol or use  drugs.     Maternal Diabetes: No Genetic Screening: Normal Maternal Ultrasounds/Referrals: Normal Fetal Ultrasounds or other Referrals:  None Maternal Substance Abuse:  No Significant Maternal Medications:  None Significant Maternal Lab Results:  None Other Comments:  Blood type O+  Review of Systems  Constitutional: Negative for chills, fever and malaise/fatigue.  Respiratory: Negative for shortness of breath.   Cardiovascular: Negative for leg swelling.  Gastrointestinal: Negative for abdominal pain, constipation, diarrhea, nausea and vomiting.  Genitourinary: Positive for frequency. Negative for dysuria.  Musculoskeletal: Negative for back pain and neck pain.  Neurological: Negative for dizziness and focal weakness.   Maternal Medical History:  Reason for admission: Nausea. Incompetent cervix Bulging membranes  Contractions: Onset was 1-2 hours ago.   Frequency: rare.    Fetal activity: Perceived fetal activity is normal.   Last perceived fetal movement was within the past hour.    Prenatal complications: Bleeding.   No PIH, placental abnormality, pre-eclampsia or preterm labor.   Prenatal Complications - Diabetes: none.   Blood pressure 117/66, pulse (!) 122, temperature 98.9 F (37.2 C), temperature source Oral, resp. rate 20, height 5\' 8"  (1.727 m), weight 286 lb (129.7 kg), last menstrual period 06/30/2016, SpO2 99 %. Maternal Exam:  Uterine Assessment: Contraction frequency is rare.   Abdomen: Patient reports no abdominal tenderness. Fundal height is 21.   Fetal presentation: no presenting part  Introitus: Normal vulva. Normal vagina.  Ferning test: negative.  Nitrazine test: not done.  Cervix: Cervix evaluated by sterile speculum exam.     Fetal Exam Fetal Monitor Review: Mode: ultrasound.   Baseline rate: 150s.     Physical Exam  Constitutional: She is oriented to person, place, and time. She appears well-developed and  well-nourished. No distress.  HENT:  Head: Normocephalic.  Cardiovascular: Normal rate and regular rhythm.   Respiratory: Effort normal and breath sounds normal. No respiratory distress.  GI: Soft. She exhibits no distension. There is no tenderness. There is no rebound and no guarding.  Genitourinary:  Genitourinary Comments: Sterile speculum exam Vagina filled with blood Membranes visible at external os which appears to be about 5cm dilated and fully effaced Negative ferning  Musculoskeletal: Normal range of motion.  Neurological: She is alert and oriented to person, place, and time.  Skin: Skin is warm and dry.  Psychiatric: She has a normal mood and affect.    Prenatal labs: ABO, Rh: O/POS/-- (11/22 1514) Antibody: NEG (11/22 1514) Rubella: 2.92 (11/22 1514) RPR: NON REAC (11/22 1514)  HBsAg: NEGATIVE (11/22 1514)  HIV: NONREACTIVE (11/22 1514)  GBS:     Assessment/Plan: [facility]UP at [redacted]w[redacted]d Incompetent cervix Bulging membranes, no evidence of PPROM Vaginal bleeding  Consulted Dr Jolayne Panther Admit to Antenatal for expectant management Discussed situation with parents Discussed we may discharge her home until viability.  MD to follow      Wynelle Bourgeois 11/25/2016, 9:32 PM       Electronically signed by Aviva Signs, CNM at 11/25/2016 9:40 PM Electronically signed by Aviva Signs, CNM at 11/25/2016 9:41 PM Electronically signed by Catalina Antigua, MD at 11/26/2016 1:08 [facility]  Course: Patient was managed with tocolysis with indocin and procardia XL during her hospitalization, as well as trendelenburg. On the day of delivery, the situation for inevitable AB, based on exam, was discussed with her and her husband and recommendation for augmentation, which they were amenable to. Prior to this taking place, she started to have more abdominal pain and VB and subsequently delivered the fetus and placenta spontaneously.  They declined autopsy, fetus was grossly  normal, female, weighed 400gm and was 25.5cm in length (see delivery note for full details). They desired discharged to home on PPD#0. Laboratory work up for early delivery was not done while she was inpatient.   Discharge Exam:   Current Vital Signs 24h Vital Sign Ranges  T 98.9 F (37.2 C) Temp  Avg: 98.6 F (37 C)  Min: 98.1 F (36.7 C)  Max: 98.9 F (37.2 C)  BP 104/64 BP  Min: 99/59  Max: 127/64  HR 96 Pulse  Avg: 103.4  Min: 96  Max: 110  RR 18 Resp  Avg: 17.5  Min: 16  Max: 18  SaO2 96 % Not Delivered SpO2  Avg: 97.5 %  Min: 96 %  Max: 99 %       24 Hour I/O Current Shift I/O  Time Ins Outs No intake/output data recorded. No intake/output data recorded.    General appearance: Well nourished, well developed female in no acute distress.  Cardiovascular: S1, S2 normal, no murmur, rub or gallop, regular rate and rhythm Respiratory:  Clear to auscultation bilateral. Normal respiratory effort Abdomen: obese, positive bowel sounds and no masses, hernias; diffusely non tender to palpation, non distended Neuro/Psych:  Normal mood and affect.  Skin:  Warm and dry.  GU: no gross VB  Discharge Disposition:  Home  Patient Instructions:  standard   Results Pending at Discharge:  Placental pathology  Discharge Medications: Allergies as of 11/29/2016   No Known Allergies     Medication List    TAKE these medications   ibuprofen 600 MG tablet Commonly known as:  ADVIL,MOTRIN Take 1 tablet (600 mg total) by mouth every 6 (six) hours as needed.   multivitamin-prenatal 27-0.8 MG Tabs tablet Take 1 tablet by mouth daily at 12 noon.   pantoprazole 40 MG tablet Commonly known as:  PROTONIX Take 1 tablet (40 mg total) by mouth daily.   zolpidem 5 MG tablet Commonly known as:  AMBIEN Take 1 tablet (5 mg total) by mouth at bedtime as needed for sleep.        No future appointments.  Request sent to clinic for a 7-10d PP visit.   Cornelia Copa MD Attending Center  for [facility])

## 2016-11-29 NOTE — Progress Notes (Signed)
House coverage [facility]. Patient to go to funeral home tomorrow to sign some papers.

## 2016-11-29 NOTE — Progress Notes (Signed)
GYN Note  CTSP and patient had delivered placenta and pregnancy (en caul) prior to receiving any medication for augmentation. Newborn still alive and sac ruptured (brown fluid) and infant cord clamped and cut and handed to parents. Placenta inspected and looked grossly normal, non malodorous; will send placenta to pathology. Pitocin started.  Once parents have had time, will fully inspect newborn, get weight and length but female, appears grossly normal and anus appears patent.  Melanie Harmon, Jr MD Attending [facility] 11/29/2016 Time: 1104am

## 2016-11-29 NOTE — Progress Notes (Signed)
House coverage notified family ready for infant to be taken to morgue.

## 2016-11-29 NOTE — Progress Notes (Signed)
OB Note Patient doing well and no pain or VB, doing well, and would like to go home. Newborn has passed away. They decline autopsy Newborn is 400gm, 25.5cm in length and no gross anomalies noted on detailed exam. inbasket message sent to have patient follow up in [facility] office in 7-10d.    Cornelia Copaharlie Jansen Sciuto, Jr MD Attending [facility] 11/29/2016 Time: [phone]

## 2016-11-29 NOTE — Progress Notes (Signed)
Melanie Harmon and Melanie Harmon had questions about funeral homes (per [employer]) and I provided information.  I alsoAntonieta Harmon gave them information about Heartstrings and other support resources.  I offered a blessing for their [facility], Melanie Harmon[facility] and brought them choices of angel pouches, which they also received when their son Melanie Harmon was born at 518 weeks.  Family is likely to use [facility] and plans to have their baby cremated.  Please page as needs arise over the weekend.  Chaplain Dyanne CarrelKaty Bliss Behnke, Bcc Pager, [phone] 3:29 PM    11/29/16 1500  Clinical Encounter Type  Visited With Patient and family together  Visit Type Spiritual support  Referral From Nurse

## 2016-12-02 ENCOUNTER — Encounter (HOSPITAL_COMMUNITY): Payer: Self-pay | Admitting: *Deleted

## 2016-12-09 ENCOUNTER — Ambulatory Visit: Payer: Medicaid Other | Admitting: Family Medicine

## 2016-12-13 ENCOUNTER — Ambulatory Visit (INDEPENDENT_AMBULATORY_CARE_PROVIDER_SITE_OTHER): Payer: Medicaid Other | Admitting: Family Medicine

## 2016-12-13 ENCOUNTER — Encounter: Payer: Self-pay | Admitting: Family Medicine

## 2016-12-13 VITALS — BP 99/47 | HR 77 | Ht 68.0 in | Wt 285.0 lb

## 2016-12-13 DIAGNOSIS — N883 Incompetence of cervix uteri: Secondary | ICD-10-CM

## 2016-12-13 NOTE — Patient Instructions (Signed)
Etonogestrel implant What is this medicine? ETONOGESTREL (et oh noe JES trel) is a contraceptive (birth control) device. It is used to prevent pregnancy. It can be used for up to 3 years. This medicine may be used for other purposes; ask your health care provider or pharmacist if you have questions. COMMON BRAND NAME(S): Implanon, Nexplanon What should I tell my health care provider before I take this medicine? They need to know if you have any of these conditions: -abnormal vaginal bleeding -blood vessel disease or blood clots -cancer of the breast, cervix, or liver -depression -diabetes -gallbladder disease -headaches -heart disease or recent heart attack -high blood pressure -high cholesterol -kidney disease -liver disease -renal disease -seizures -tobacco smoker -an unusual or allergic reaction to etonogestrel, other hormones, anesthetics or antiseptics, medicines, foods, dyes, or preservatives -pregnant or trying to get pregnant -breast-feeding How should I use this medicine? This device is inserted just under the skin on the inner side of your upper arm by a health care professional. Talk to your pediatrician regarding the use of this medicine in children. Special care may be needed. Overdosage: If you think you have taken too much of this medicine contact a poison control center or emergency room at once. NOTE: This medicine is only for you. Do not share this medicine with others. What if I miss a dose? This does not apply. What may interact with this medicine? Do not take this medicine with any of the following medications: -amprenavir -bosentan -fosamprenavir This medicine may also interact with the following medications: -barbiturate medicines for inducing sleep or treating seizures -certain medicines for fungal infections like ketoconazole and itraconazole -grapefruit juice -griseofulvin -medicines to treat seizures like carbamazepine, felbamate, oxcarbazepine,  phenytoin, topiramate -modafinil -phenylbutazone -rifampin -rufinamide -some medicines to treat HIV infection like atazanavir, indinavir, lopinavir, nelfinavir, tipranavir, ritonavir -St. John's wort This list may not describe all possible interactions. Give your health care provider a list of all the medicines, herbs, non-prescription drugs, or dietary supplements you use. Also tell them if you smoke, drink alcohol, or use illegal drugs. Some items may interact with your medicine. What should I watch for while using this medicine? This product does not protect you against HIV infection (AIDS) or other sexually transmitted diseases. You should be able to feel the implant by pressing your fingertips over the skin where it was inserted. Contact your doctor if you cannot feel the implant, and use a non-hormonal birth control method (such as condoms) until your doctor confirms that the implant is in place. If you feel that the implant may have broken or become bent while in your arm, contact your healthcare provider. What side effects may I notice from receiving this medicine? Side effects that you should report to your doctor or health care professional as soon as possible: -allergic reactions like skin rash, itching or hives, swelling of the face, lips, or tongue -breast lumps -changes in emotions or moods -depressed mood -heavy or prolonged menstrual bleeding -pain, irritation, swelling, or bruising at the insertion site -scar at site of insertion -signs of infection at the insertion site such as fever, and skin redness, pain or discharge -signs of pregnancy -signs and symptoms of a blood clot such as breathing problems; changes in vision; chest pain; severe, sudden headache; pain, swelling, warmth in the leg; trouble speaking; sudden numbness or weakness of the face, arm or leg -signs and symptoms of liver injury like dark yellow or brown urine; general ill feeling or flu-like symptoms;  light-colored   stools; loss of appetite; nausea; right upper belly pain; unusually weak or tired; yellowing of the eyes or skin -unusual vaginal bleeding, discharge -signs and symptoms of a stroke like changes in vision; confusion; trouble speaking or understanding; severe headaches; sudden numbness or weakness of the face, arm or leg; trouble walking; dizziness; loss of balance or coordination Side effects that usually do not require medical attention (report to your doctor or health care professional if they continue or are bothersome): -acne -back pain -breast pain -changes in weight -dizziness -general ill feeling or flu-like symptoms -headache -irregular menstrual bleeding -nausea -sore throat -vaginal irritation or inflammation This list may not describe all possible side effects. Call your doctor for medical advice about side effects. You may report side effects to FDA at 1-800-FDA-1088. Where should I keep my medicine? This drug is given in a hospital or clinic and will not be stored at home. NOTE: This sheet is a summary. It may not cover all possible information. If you have questions about this medicine, talk to your doctor, pharmacist, or health care provider.  2018 Elsevier/Gold Standard (2016-04-04 11:19:22)   Levonorgestrel intrauterine device (IUD) What is this medicine? LEVONORGESTREL IUD (LEE voe nor jes trel) is a contraceptive (birth control) device. The device is placed inside the uterus by a healthcare professional. It is used to prevent pregnancy. This device can also be used to treat heavy bleeding that occurs during your period. This medicine may be used for other purposes; ask your health care provider or pharmacist if you have questions. COMMON BRAND NAME(S): Kyleena, LILETTA, Mirena, Skyla What should I tell my health care provider before I take this medicine? They need to know if you have any of these conditions: -abnormal Pap smear -cancer of the breast,  uterus, or cervix -diabetes -endometritis -genital or pelvic infection now or in the past -have more than one sexual partner or your partner has more than one partner -heart disease -history of an ectopic or tubal pregnancy -immune system problems -IUD in place -liver disease or tumor -problems with blood clots or take blood-thinners -seizures -use intravenous drugs -uterus of unusual shape -vaginal bleeding that has not been explained -an unusual or allergic reaction to levonorgestrel, other hormones, silicone, or polyethylene, medicines, foods, dyes, or preservatives -pregnant or trying to get pregnant -breast-feeding How should I use this medicine? This device is placed inside the uterus by a health care professional. Talk to your pediatrician regarding the use of this medicine in children. Special care may be needed. Overdosage: If you think you have taken too much of this medicine contact a poison control center or emergency room at once. NOTE: This medicine is only for you. Do not share this medicine with others. What if I miss a dose? This does not apply. Depending on the brand of device you have inserted, the device will need to be replaced every 3 to 5 years if you wish to continue using this type of birth control. What may interact with this medicine? Do not take this medicine with any of the following medications: -amprenavir -bosentan -fosamprenavir This medicine may also interact with the following medications: -aprepitant -armodafinil -barbiturate medicines for inducing sleep or treating seizures -bexarotene -boceprevir -griseofulvin -medicines to treat seizures like carbamazepine, ethotoin, felbamate, oxcarbazepine, phenytoin, topiramate -modafinil -pioglitazone -rifabutin -rifampin -rifapentine -some medicines to treat HIV infection like atazanavir, efavirenz, indinavir, lopinavir, nelfinavir, tipranavir, ritonavir -St. John's wort -warfarin This list may  not describe all possible interactions. Give your health care provider   of all the medicines, herbs, non-prescription drugs, or dietary supplements you use. Also tell them if you smoke, drink alcohol, or use illegal drugs. Some items may interact with your medicine. What should I watch for while using this medicine? Visit your doctor or health care professional for regular check ups. See your doctor if you or your partner has sexual contact with others, becomes HIV positive, or gets a sexual transmitted disease. This product does not protect you against HIV infection (AIDS) or other sexually transmitted diseases. You can check the placement of the IUD yourself by reaching up to the top of your vagina with clean fingers to feel the threads. Do not pull on the threads. It is a good habit to check placement after each menstrual period. Call your doctor right away if you feel more of the IUD than just the threads or if you cannot feel the threads at all. The IUD may come out by itself. You may become pregnant if the device comes out. If you notice that the IUD has come out use a backup birth control method like condoms and call your health care provider. Using tampons will not change the position of the IUD and are okay to use during your period. This IUD can be safely scanned with magnetic resonance imaging (MRI) only under specific conditions. Before you have an MRI, tell your healthcare provider that you have an IUD in place, and which type of IUD you have in place. What side effects may I notice from receiving this medicine? Side effects that you should report to your doctor or health care professional as soon as possible: -allergic reactions like skin rash, itching or hives, swelling of the face, lips, or tongue -fever, flu-like symptoms -genital sores -high blood pressure -no menstrual period for 6 weeks during use -pain, swelling, warmth in the leg -pelvic pain or tenderness -severe or  sudden headache -signs of pregnancy -stomach cramping -sudden shortness of breath -trouble with balance, talking, or walking -unusual vaginal bleeding, discharge -yellowing of the eyes or skin Side effects that usually do not require medical attention (report to your doctor or health care professional if they continue or are bothersome): -acne -breast pain -change in sex drive or performance -changes in weight -cramping, dizziness, or faintness while the device is being inserted -headache -irregular menstrual bleeding within first 3 to 6 months of use -nausea This list may not describe all possible side effects. Call your doctor for medical advice about side effects. You may report side effects to FDA at 1-800-FDA-1088. Where should I keep my medicine? This does not apply. NOTE: This sheet is a summary. It may not cover all possible information. If you have questions about this medicine, talk to your doctor, pharmacist, or health care provider.  2018 Elsevier/Gold Standard (2016-06-28 14:14:56)

## 2016-12-13 NOTE — Progress Notes (Signed)
   Subjective:    Patient ID: Melanie Harmon, female    DOB: [birth year], 26 y.o.   MRN: [id number]  HPI Patient seen 2 weeks following the mid second trimester loss secondary to cervical incompetence and subsequent preterm labor. Patient was hospitalized with bulging membranes and being dilated to 3-4 cm visually. She delivered on 3/2. She is grieving appropriately. She denies headache or vision changes, problems with her breasts, abnormal uterine bleeding.   Review of Systems     Objective:   Physical Exam  Constitutional: She appears well-developed and well-nourished.  HENT:  Head: Normocephalic and atraumatic.  Neck: Normal range of motion. Neck supple.  Cardiovascular: Normal rate, regular rhythm and normal heart sounds.   Pulmonary/Chest: Effort normal and breath sounds normal.  Abdominal: Soft. She exhibits no distension and no mass. There is no tenderness. There is no rebound and no guarding.  Skin: Skin is warm and dry.  Psychiatric: She has a normal mood and affect. Her behavior is normal. Judgment and thought content normal.      Assessment & Plan:  1. Preterm labor in second trimester with preterm delivery, single or unspecified fetus 2. Cervical incompetence This unfortunately happened in the previable state. We had a lengthy discussion regarding prophylaxis in subsequent pregnancies. I discussed with her that I recommended prophylactic cerclage, as well as Makena. She would need cervical length ultrasounds starting around 18 weeks. I recommended that she continue to take a multivitamin in order to build up her calcium and iron stores. I offered her contraception: We discussed Nexplanon and IUD. Patient undecided. She will return or call with decision.

## 2017-01-29 ENCOUNTER — Ambulatory Visit: Payer: Medicaid Other

## 2017-05-01 ENCOUNTER — Ambulatory Visit: Payer: Medicaid Other | Admitting: Family Medicine

## 2017-07-18 ENCOUNTER — Other Ambulatory Visit: Payer: Self-pay | Admitting: Family Medicine

## 2017-07-18 ENCOUNTER — Ambulatory Visit (INDEPENDENT_AMBULATORY_CARE_PROVIDER_SITE_OTHER): Payer: Medicaid Other | Admitting: Family Medicine

## 2017-07-18 ENCOUNTER — Encounter: Payer: Self-pay | Admitting: Family Medicine

## 2017-07-18 ENCOUNTER — Other Ambulatory Visit (HOSPITAL_COMMUNITY)
Admission: RE | Admit: 2017-07-18 | Discharge: 2017-07-18 | Disposition: A | Discharge status: Home / Self Care | Payer: Medicaid Other | Source: Ambulatory Visit | Attending: Family Medicine | Admitting: Family Medicine

## 2017-07-18 VITALS — BP 118/70 | HR 97 | Wt 298.0 lb

## 2017-07-18 DIAGNOSIS — O09299 Supervision of pregnancy with other poor reproductive or obstetric history, unspecified trimester: Secondary | ICD-10-CM

## 2017-07-18 DIAGNOSIS — Z3A01 Less than 8 weeks gestation of pregnancy: Secondary | ICD-10-CM | POA: Insufficient documentation

## 2017-07-18 DIAGNOSIS — Z6841 Body Mass Index (BMI) 40.0 and over, adult: Secondary | ICD-10-CM | POA: Diagnosis not present

## 2017-07-18 DIAGNOSIS — O09899 Supervision of other high risk pregnancies, unspecified trimester: Secondary | ICD-10-CM | POA: Insufficient documentation

## 2017-07-18 DIAGNOSIS — Z3491 Encounter for supervision of normal pregnancy, unspecified, first trimester: Secondary | ICD-10-CM | POA: Insufficient documentation

## 2017-07-18 DIAGNOSIS — Z113 Encounter for screening for infections with a predominantly sexual mode of transmission: Secondary | ICD-10-CM | POA: Diagnosis not present

## 2017-07-18 DIAGNOSIS — O09291 Supervision of pregnancy with other poor reproductive or obstetric history, first trimester: Secondary | ICD-10-CM

## 2017-07-18 DIAGNOSIS — Z3481 Encounter for supervision of other normal pregnancy, first trimester: Secondary | ICD-10-CM

## 2017-07-18 DIAGNOSIS — Z3480 Encounter for supervision of other normal pregnancy, unspecified trimester: Secondary | ICD-10-CM

## 2017-07-18 NOTE — Progress Notes (Signed)
Subjective:  Melanie Harmon is a [id number] [redacted]w[redacted]d being seen today for Melanie Harmon first obstetrical visit.  Melanie Harmon obstetrical history is significant for History of incompetent cervix with previous losses between 18 and 22 weeks., morbidly obese with BMI of 45.Marland Kitchen Patient does intend to breast feed. Pregnancy history fully reviewed.  Patient reports no complaints.  BP 118/70   Pulse 97   Wt 298 lb (135.2 kg)   LMP 06/03/2017   BMI 45.31 kg/m   HISTORY: OB History  Gravida Para Term Preterm AB Living  5 1   1 3 1   SAB TAB Ectopic Multiple Live Births  2 1   0 1    # Outcome Date GA Lbr Len/2nd Weight Sex Delivery Anes PTL Lv  5 Current           4 Preterm 11/29/16 [redacted]w[redacted]d  14.1 oz (0.4 kg) F Vag-Spont None  LIV  3 SAB  [redacted]w[redacted]d         2 SAB           1 TAB               Past Medical History:  Diagnosis Date  . Headaches due to old head injury 10/23/2016    Past Surgical History:  Procedure Laterality Date  . DILATION AND CURETTAGE OF UTERUS    . Dilation and Curretage    . WISDOM TOOTH EXTRACTION      Family History  Problem Relation Age of Onset  . Heart attack Maternal Grandmother   . Heart attack Maternal Grandfather      Exam    Uterus:     Pelvic Exam:    Perineum: No Hemorrhoids, Normal Perineum   Vulva: normal, Bartholin's, Urethra, Skene's normal   Vagina:  normal mucosa   Cervix: multiparous appearance, no cervical motion tenderness, no lesions and Cervix closed visually and digitally.   Adnexa: normal adnexa and no mass, fullness, tenderness   Bony Pelvis: gynecoid  System: Breast:  normal appearance, no masses or tenderness, Inspection negative, No nipple retraction or dimpling, No nipple discharge or bleeding, No axillary or supraclavicular adenopathy, Normal to palpation without dominant masses   Skin: normal coloration and turgor, no rashes    Neurologic: oriented, normal, gait normal; reflexes normal and symmetric   Extremities: normal strength, tone, and muscle  mass   HEENT PERRLA, extra ocular movement intact and sclera clear, anicteric   Mouth/Teeth mucous membranes moist, pharynx normal without lesions   Neck supple and no masses   Cardiovascular: regular rate and rhythm, no murmurs or gallops   Respiratory:  appears well, vitals normal, no respiratory distress, acyanotic, normal RR, ear and throat exam is normal, neck free of mass or lymphadenopathy, chest clear, no wheezing, crepitations, rhonchi, normal symmetric air entry   Abdomen: soft, non-tender; bowel sounds normal; no masses,  no organomegaly   Urinary: urethral meatus normal      Assessment:    Pregnancy: A5W0981 Patient Active Problem List   Diagnosis Date Noted  . Supervision of other normal pregnancy, antepartum 07/18/2017  . History of cervical incompetence in pregnancy, currently pregnant, unspecified trimester 11/25/2016  . Headaches due to old head injury 10/23/2016      Plan:   1. Supervision of other normal pregnancy, antepartum Genetic Screening discussed First Screen and Quad Screen: declined.  Ultrasound discussed; fetal survey: requested.  Follow up in 4 weeks.  - CULTURE, URINE COMPREHENSIVE - GC/Chlamydia probe amp ()not at [facility]  2. History  of cervical incompetence in pregnancy, currently pregnant, unspecified trimester Discussed cerclage placed prophylactically between 12 and 14 weeks.  We will also get cervical length between 16 and 18 weeks. I also discussed the option of starting vaginal progesterone between 16 and 18 weeks - although there are no study showing the benefit of prophylactic vaginal progesterone, it is recommended in some expert opinions.  Patient would like to start vaginal progesterone at that point in the pregnancy.  3. Morbid obesity (HCC) 10-15 pound weight gain recommended during pregnancy.   Problem list reviewed and updated. 75% of 30 min visit spent on counseling and coordination of care.     Levie HeritageJacob J  Jaymes Revels 07/18/2017

## 2017-07-22 LAB — GC/CHLAMYDIA PROBE AMP (~~LOC~~) NOT AT ARMC
CHLAMYDIA, DNA PROBE: NEGATIVE
NEISSERIA GONORRHEA: NEGATIVE

## 2017-07-23 ENCOUNTER — Other Ambulatory Visit (INDEPENDENT_AMBULATORY_CARE_PROVIDER_SITE_OTHER): Payer: Medicaid Other

## 2017-07-23 VITALS — BP 118/68 | HR 100 | Wt 298.0 lb

## 2017-07-23 DIAGNOSIS — Z3687 Encounter for antenatal screening for uncertain dates: Secondary | ICD-10-CM | POA: Diagnosis not present

## 2017-07-23 DIAGNOSIS — N912 Amenorrhea, unspecified: Secondary | ICD-10-CM

## 2017-07-23 LAB — CULTURE, URINE COMPREHENSIVE

## 2017-07-23 NOTE — Progress Notes (Signed)
Patient presents for ultrasound.   DATING AND VIABILITY SONOGRAM   Melanie Harmon is a 26 y.o. year old 425P0131 with LMP Patient's last menstrual period was 06/03/2017. which would correlate to  69252w1d weeks gestation.  She has irregular menstrual cycles.   She is here today for a confirmatory initial sonogram.  GESTATION: SINGLETON  FETAL ACTIVITY:          Heart rate         None visualized            ADNEXA: The ovaries are normal.   GESTATIONAL AGE AND  BIOMETRICS:  Gestational criteria: Estimated Date of Delivery: 03/10/18 by LMP now at 73252w1d  Previous Scans:0  GESTATIONAL SAC            2.44cm       7-2 weeks  CROWN RUMP LENGTH    None visualized                                                                                      TECHNICIAN COMMENTS: will plan to do HCG. Plan of care will decided upon results of HCG.   A copy of this report including all images has been saved and backed up to a second source for retrieval if needed. All measures and details of the anatomical scan, placentation, fluid volume and pelvic anatomy are contained in that report.  Armandina StammerJennifer Howard 07/23/2017 9:31 AM

## 2017-07-24 ENCOUNTER — Telehealth: Payer: Self-pay

## 2017-07-24 ENCOUNTER — Other Ambulatory Visit: Payer: Self-pay

## 2017-07-24 DIAGNOSIS — O3680X0 Pregnancy with inconclusive fetal viability, not applicable or unspecified: Secondary | ICD-10-CM

## 2017-07-24 DIAGNOSIS — Z3490 Encounter for supervision of normal pregnancy, unspecified, unspecified trimester: Secondary | ICD-10-CM

## 2017-07-24 LAB — BETA HCG QUANT (REF LAB): hCG Quant: 43453 m[IU]/mL

## 2017-07-24 NOTE — Telephone Encounter (Signed)
Attempted to reach patient by phone and getting message line disconnected. Will send my chart message.   Patient HCG consistent with 7 week pregnancy and per Dr. Adrian BlackwaterStinson we will have her get a formal ultrasound.

## 2017-07-24 NOTE — Telephone Encounter (Signed)
[facility] 8 [facility]. Armandina StammerJennifer Howard RNBSN

## 2017-07-24 NOTE — Telephone Encounter (Signed)
Patient called back and verified phone number. [facility]

## 2017-07-29 ENCOUNTER — Ambulatory Visit (HOSPITAL_COMMUNITY)
Admission: RE | Admit: 2017-07-29 | Discharge: 2017-07-29 | Disposition: A | Discharge status: Home / Self Care | Payer: Medicaid Other | Source: Ambulatory Visit | Attending: Family Medicine | Admitting: Family Medicine

## 2017-07-29 ENCOUNTER — Telehealth: Payer: Self-pay

## 2017-07-29 DIAGNOSIS — O3680X0 Pregnancy with inconclusive fetal viability, not applicable or unspecified: Secondary | ICD-10-CM | POA: Insufficient documentation

## 2017-07-29 DIAGNOSIS — Z3490 Encounter for supervision of normal pregnancy, unspecified, unspecified trimester: Secondary | ICD-10-CM

## 2017-07-29 DIAGNOSIS — Z3A08 8 weeks gestation of pregnancy: Secondary | ICD-10-CM | POA: Diagnosis not present

## 2017-07-29 MED ORDER — PANTOPRAZOLE SODIUM 40 MG PO TBEC: 40.0000 mg | DELAYED_RELEASE_TABLET | Freq: Every day | ORAL | 3 refills | Status: DC

## 2017-07-29 NOTE — Telephone Encounter (Signed)
Patient called and would like something for heartburn. Patient states she forgot to mention it at her NEw ob due to all the other items they discussed. She is requesting the medication Dr. Adrian BlackwaterStinson gave her last time. Pharmacy checked and [facility] RNBSN

## 2017-07-31 ENCOUNTER — Encounter: Payer: Self-pay | Admitting: Family Medicine

## 2017-08-01 ENCOUNTER — Telehealth: Payer: Self-pay

## 2017-08-01 MED ORDER — DOXYLAMINE-PYRIDOXINE 10-10 MG PO TBEC: 1.0000 | DELAYED_RELEASE_TABLET | Freq: Two times a day (BID) | ORAL | 2 refills | Status: DC

## 2017-08-01 NOTE — Telephone Encounter (Signed)
Patient called complaining of nausea and vomiting that started yesterday. She has had several episodes of vomiting/dry heaving.  Rx sent in for diclegis.   Armandina StammerJennifer Hristopher Missildine RNBSN

## 2017-08-14 ENCOUNTER — Ambulatory Visit (INDEPENDENT_AMBULATORY_CARE_PROVIDER_SITE_OTHER): Payer: Medicaid Other | Admitting: Family Medicine

## 2017-08-14 ENCOUNTER — Encounter: Payer: Self-pay | Admitting: Family Medicine

## 2017-08-14 VITALS — BP 114/67 | HR 104 | Wt 298.0 lb

## 2017-08-14 DIAGNOSIS — O09291 Supervision of pregnancy with other poor reproductive or obstetric history, first trimester: Secondary | ICD-10-CM

## 2017-08-14 DIAGNOSIS — Z3481 Encounter for supervision of other normal pregnancy, first trimester: Secondary | ICD-10-CM

## 2017-08-14 DIAGNOSIS — O99211 Obesity complicating pregnancy, first trimester: Secondary | ICD-10-CM

## 2017-08-14 DIAGNOSIS — O09299 Supervision of pregnancy with other poor reproductive or obstetric history, unspecified trimester: Secondary | ICD-10-CM

## 2017-08-14 DIAGNOSIS — Z3480 Encounter for supervision of other normal pregnancy, unspecified trimester: Secondary | ICD-10-CM

## 2017-08-14 NOTE — Progress Notes (Signed)
   PRENATAL VISIT NOTE  Subjective:  Gifford ShaveZari Dicocco is a 26 y.o. [id number] at 5931w2d being seen today for ongoing prenatal care.  She is currently monitored for the following issues for this high-risk pregnancy and has Headaches due to old head injury; History of cervical incompetence in pregnancy, currently pregnant, unspecified trimester; Supervision of other normal pregnancy, antepartum; and Morbid obesity (HCC) on their problem list.  Patient reports no complaints. Had sinus infection and had N/V due to all the drainage.   . Vag. Bleeding: None.   . Denies leaking of fluid.   The following portions of the patient's history were reviewed and updated as appropriate: allergies, current medications, past family history, past medical history, past social history, past surgical history and problem list. Problem list updated.  Objective:   Vitals:   08/14/17 0851  BP: 114/67  Pulse: (!) 104  Weight: 298 lb (135.2 kg)    Fetal Status:           General:  Alert, oriented and cooperative. Patient is in no acute distress.  Skin: Skin is warm and dry. No rash noted.   Cardiovascular: Normal heart rate noted  Respiratory: Normal respiratory effort, no problems with respiration noted  Abdomen: Soft, gravid, appropriate for gestational age.  Pain/Pressure: Absent     Pelvic: Cervical exam deferred        Extremities: Normal range of motion.  Edema: None  Mental Status:  Normal mood and affect. Normal behavior. Normal judgment and thought content.   Assessment and Plan:  Pregnancy: G5P0131 at 8131w2d  1. Supervision of other normal pregnancy, antepartum FHT normal  2. History of cervical incompetence in pregnancy, currently pregnant, unspecified trimester Will schedule cerclage  3. Morbid obesity (HCC) Weight gain appropriate  Preterm labor symptoms and general obstetric precautions including but not limited to vaginal bleeding, contractions, leaking of fluid and fetal movement were reviewed in  detail with the patient. Please refer to After Visit Summary for other counseling recommendations.  Return in about 4 weeks (around 09/11/2017) for OB f/u.   Levie HeritageJacob J Maevyn Riordan, DO

## 2017-08-18 ENCOUNTER — Encounter (HOSPITAL_COMMUNITY): Payer: Self-pay

## 2017-08-29 NOTE — Patient Instructions (Addendum)
Your procedure is scheduled on:  Thursday, Dec 6  [facility] at:  9 am  Pick up the phone at the desk and dial [phone].  Call this number if you have problems the morning of surgery: [phone].  Remember: Do NOT eat food or drink clear liquids (including water) after Midnight Wednesday  Take these medicines the morning of surgery with a SIP OF WATER:  protonix Stop herbal medications and supplements at this time.  Do NOT wear jewelry (body piercing), metal hair clips/bobby pins, make-up, or nail polish. Do NOT wear lotions, powders, or perfumes.  You may wear deoderant. Do NOT shave for 48 hours prior to surgery. Do NOT bring valuables to the hospital.  Have a responsible adult drive you home and stay with you for 24 hours after your procedure.  Home with Fiance Melanie Harmon cell [phone].

## 2017-09-03 ENCOUNTER — Other Ambulatory Visit: Payer: Self-pay

## 2017-09-03 ENCOUNTER — Encounter (HOSPITAL_COMMUNITY)
Admission: RE | Admit: 2017-09-03 | Discharge: 2017-09-03 | Disposition: A | Discharge status: Home / Self Care | Payer: Medicaid Other | Source: Ambulatory Visit | Attending: Obstetrics & Gynecology | Admitting: Obstetrics & Gynecology

## 2017-09-03 ENCOUNTER — Encounter (HOSPITAL_COMMUNITY): Payer: Self-pay

## 2017-09-03 DIAGNOSIS — O3431 Maternal care for cervical incompetence, first trimester: Secondary | ICD-10-CM | POA: Diagnosis not present

## 2017-09-03 DIAGNOSIS — Z01812 Encounter for preprocedural laboratory examination: Secondary | ICD-10-CM | POA: Insufficient documentation

## 2017-09-03 HISTORY — DX: Maternal care for intrauterine death, not applicable or unspecified: O36.4XX0

## 2017-09-03 HISTORY — DX: Missed abortion: O02.1

## 2017-09-03 HISTORY — DX: Gastro-esophageal reflux disease without esophagitis: K21.9

## 2017-09-03 HISTORY — DX: Headache: R51

## 2017-09-03 HISTORY — DX: Headache, unspecified: R51.9

## 2017-09-03 LAB — CBC
HEMATOCRIT: 41 % (ref 36.0–46.0)
HEMOGLOBIN: 13.9 g/dL (ref 12.0–15.0)
MCH: 30.8 pg (ref 26.0–34.0)
MCHC: 33.9 g/dL (ref 30.0–36.0)
MCV: 90.9 fL (ref 78.0–100.0)
Platelets: 227 10*3/uL (ref 150–400)
RBC: 4.51 MIL/uL (ref 3.87–5.11)
RDW: 12.8 % (ref 11.5–15.5)
WBC: 7.4 10*3/uL (ref 4.0–10.5)

## 2017-09-04 ENCOUNTER — Encounter (HOSPITAL_COMMUNITY): Payer: Self-pay | Admitting: *Deleted

## 2017-09-04 ENCOUNTER — Ambulatory Visit (HOSPITAL_COMMUNITY): Payer: Medicaid Other | Admitting: Anesthesiology

## 2017-09-04 ENCOUNTER — Ambulatory Visit (HOSPITAL_COMMUNITY)
Admission: RE | Admit: 2017-09-04 | Discharge: 2017-09-04 | Disposition: A | Discharge status: Home / Self Care | Payer: Medicaid Other | Source: Ambulatory Visit | Attending: Obstetrics & Gynecology | Admitting: Obstetrics & Gynecology

## 2017-09-04 ENCOUNTER — Encounter (HOSPITAL_COMMUNITY)
Admission: RE | Disposition: A | Discharge status: Home / Self Care | Payer: Self-pay | Source: Ambulatory Visit | Attending: Obstetrics & Gynecology

## 2017-09-04 DIAGNOSIS — K219 Gastro-esophageal reflux disease without esophagitis: Secondary | ICD-10-CM | POA: Insufficient documentation

## 2017-09-04 DIAGNOSIS — O09299 Supervision of pregnancy with other poor reproductive or obstetric history, unspecified trimester: Secondary | ICD-10-CM

## 2017-09-04 DIAGNOSIS — Z3A13 13 weeks gestation of pregnancy: Secondary | ICD-10-CM | POA: Insufficient documentation

## 2017-09-04 DIAGNOSIS — O99611 Diseases of the digestive system complicating pregnancy, first trimester: Secondary | ICD-10-CM | POA: Diagnosis not present

## 2017-09-04 DIAGNOSIS — O3431 Maternal care for cervical incompetence, first trimester: Secondary | ICD-10-CM | POA: Diagnosis present

## 2017-09-04 DIAGNOSIS — O09899 Supervision of other high risk pregnancies, unspecified trimester: Secondary | ICD-10-CM

## 2017-09-04 HISTORY — PX: CERVICAL CERCLAGE: SHX1329

## 2017-09-04 SURGERY — CERCLAGE, CERVIX, VAGINAL APPROACH
Anesthesia: Spinal | Site: Vagina

## 2017-09-04 MED ORDER — INDOMETHACIN 50 MG PO CAPS: 50.0000 mg | ORAL_CAPSULE | Freq: Three times a day (TID) | ORAL | 0 refills | Status: DC

## 2017-09-04 MED ORDER — MEPERIDINE HCL 25 MG/ML IJ SOLN: 6.2500 mg | INTRAMUSCULAR | Status: DC | PRN

## 2017-09-04 MED ORDER — PHENYLEPHRINE HCL 10 MG/ML IJ SOLN
INTRAMUSCULAR | Status: DC | PRN
  Administered 2017-09-04 (×2): 40 ug via INTRAVENOUS
  Administered 2017-09-04: 80 ug via INTRAVENOUS
  Administered 2017-09-04: 40 ug via INTRAVENOUS
  Administered 2017-09-04: 80 ug via INTRAVENOUS
  Administered 2017-09-04: 40 ug via INTRAVENOUS

## 2017-09-04 MED ORDER — LACTATED RINGERS IV SOLN: INTRAVENOUS | Status: DC

## 2017-09-04 MED ORDER — ONDANSETRON HCL 4 MG/2ML IJ SOLN
INTRAMUSCULAR | Status: AC
  Filled 2017-09-04: qty 2

## 2017-09-04 MED ORDER — BUPIVACAINE IN DEXTROSE 0.75-8.25 % IT SOLN
INTRATHECAL | Status: DC | PRN
  Administered 2017-09-04: 1.6 mL via INTRATHECAL

## 2017-09-04 MED ORDER — INDOMETHACIN 50 MG RE SUPP
RECTAL | Status: DC | PRN
  Administered 2017-09-04: 100 mg via RECTAL

## 2017-09-04 MED ORDER — DOXYCYCLINE HYCLATE 100 MG IV SOLR
200.0000 mg | INTRAVENOUS | Status: AC
  Administered 2017-09-04: 200 mg via INTRAVENOUS
  Filled 2017-09-04: qty 200

## 2017-09-04 MED ORDER — ONDANSETRON HCL 4 MG/2ML IJ SOLN: 4.0000 mg | Freq: Once | INTRAMUSCULAR | Status: DC | PRN

## 2017-09-04 MED ORDER — OXYCODONE-ACETAMINOPHEN 5-325 MG PO TABS: 1.0000 | ORAL_TABLET | Freq: Four times a day (QID) | ORAL | 0 refills | Status: DC | PRN

## 2017-09-04 MED ORDER — FENTANYL CITRATE (PF) 100 MCG/2ML IJ SOLN
25.0000 ug | INTRAMUSCULAR | Status: DC | PRN
Start: 2017-09-04 — End: 2017-09-04

## 2017-09-04 MED ORDER — PHENYLEPHRINE 40 MCG/ML (10ML) SYRINGE FOR IV PUSH (FOR BLOOD PRESSURE SUPPORT)
PREFILLED_SYRINGE | INTRAVENOUS | Status: AC
  Filled 2017-09-04: qty 10

## 2017-09-04 MED ORDER — ONDANSETRON HCL 4 MG/2ML IJ SOLN
INTRAMUSCULAR | Status: DC | PRN
  Administered 2017-09-04: 4 mg via INTRAVENOUS

## 2017-09-04 MED ORDER — INDOMETHACIN 50 MG RE SUPP
100.0000 mg | Freq: Once | RECTAL | Status: DC
  Filled 2017-09-04: qty 2

## 2017-09-04 MED ORDER — LACTATED RINGERS IV SOLN
INTRAVENOUS | Status: DC
  Administered 2017-09-04: 1000 mL via INTRAVENOUS
  Administered 2017-09-04: 09:00:00 via INTRAVENOUS

## 2017-09-04 MED ORDER — BUPIVACAINE HCL (PF) 0.5 % IJ SOLN
INTRAMUSCULAR | Status: AC
  Filled 2017-09-04: qty 30

## 2017-09-04 MED ORDER — BUPIVACAINE HCL (PF) 0.5 % IJ SOLN
INTRAMUSCULAR | Status: DC | PRN
  Administered 2017-09-04: 20 mL

## 2017-09-04 SURGICAL SUPPLY — 17 items
CANISTER SUCT 3000ML PPV (MISCELLANEOUS) ×3 IMPLANT
GLOVE BIO SURGEON STRL SZ7 (GLOVE) ×3 IMPLANT
GLOVE BIOGEL PI IND STRL 7.0 (GLOVE) ×2 IMPLANT
GLOVE BIOGEL PI INDICATOR 7.0 (GLOVE) ×4
GOWN STRL REUS W/TWL LRG LVL3 (GOWN DISPOSABLE) ×6 IMPLANT
GOWN STRL REUS W/TWL XL LVL3 (GOWN DISPOSABLE) ×3 IMPLANT
NEEDLE MAYO CATGUT SZ4 (NEEDLE) ×3 IMPLANT
PACK VAGINAL MINOR WOMEN LF (CUSTOM PROCEDURE TRAY) ×3 IMPLANT
PAD OB MATERNITY 4.3X12.25 (PERSONAL CARE ITEMS) ×3 IMPLANT
PAD PREP 24X48 CUFFED NSTRL (MISCELLANEOUS) ×3 IMPLANT
SUT ETHIBOND  5 (SUTURE) ×2
SUT ETHIBOND 5 (SUTURE) ×1 IMPLANT
TOWEL OR 17X24 6PK STRL BLUE (TOWEL DISPOSABLE) ×6 IMPLANT
TRAY FOLEY CATH SILVER 14FR (SET/KITS/TRAYS/PACK) ×3 IMPLANT
TUBING NON-CON 1/4 X 20 CONN (TUBING) ×2 IMPLANT
TUBING NON-CON 1/4 X 20' CONN (TUBING) ×1
YANKAUER SUCT BULB TIP NO VENT (SUCTIONS) ×3 IMPLANT

## 2017-09-04 NOTE — Op Note (Signed)
09/04/2017  11:27 AM  PATIENT:  Melanie Harmon  26 y.o. female  PRE-OPERATIVE DIAGNOSIS:  Incompetent Cervix  POST-OPERATIVE DIAGNOSIS:  Incompetent Cervix  PROCEDURE:  Procedure(s): CERCLAGE CERVICAL (N/A)  SURGEON:  Surgeon(s) and Role:    * Willodean RosenthalHarraway-Smith, Jahmil Macleod, MD - Primary  ANESTHESIA:   spinal  EBL:  40 mL   BLOOD ADMINISTERED:none  DRAINS: none   LOCAL MEDICATIONS USED:  MARCAINE     SPECIMEN:  No Specimen  DISPOSITION OF SPECIMEN:  PATHOLOGY  COUNTS:  YES  TOURNIQUET:  * No tourniquets in log *  DICTATION: .Note written in EPIC  PLAN OF CARE: Discharge to home after PACU  PATIENT DISPOSITION:  PACU - hemodynamically stable.   Delay start of Pharmacological VTE agent (>24hrs) due to surgical blood loss or risk of bleeding: not applicable  Complications: none immediate  INDICATIONS: 26 y.o. [id number] at 5679w2d with history of cervical incompetence, here for cerclage placement.   The risks of surgery were discussed in detail with the patient including but not limited to: bleeding; infection which may require antibiotic therapy; injury to cervix, vagina other surrounding organs; risk of ruptured membranes and/or preterm delivery and other postoperative or anesthesia complications.  Written informed consent was obtained.    FINDINGS:  About 3 cm palpable cervical length in the vagina, closed cervix, suture knot placed on the right side of the cervix.  PROCEDURE IN DETAIL:  The patient received intravenous antibiotics and had sequential compression devices applied to her lower extremities while in the preoperative area.  Reassuring fetal heart rate was also obtained using a doppler. She was then taken to the operating room where spinal anesthesia was administered and was found to be adequate.  She was placed in the dorsal lithotomy, and was prepped and draped in a sterile manner. Her bladder was catheterized for an unmeasured amount of clear, yellow urine.  Indomethacin  100 mg rectal suppository was placed.  After an adequate timeout was performed, a vaginal speculum was then placed in the patient's vagina and a single tooth tenaculum was applied to the anterior lip of the cervix.    The anterior and posterior lips of the cervix was grasped with ring forceps. A curved needle loaded with a number 5 Ethibond suture was inserted at 12 o'clock, as high as possible at the junction of the rugated vaginal epithelium and the smooth cervix, at least 2 cm above the external os.  Four bites are taken circumferentially around the entire cervix in a purse-string fashion, each bite should be deep enough to extend at least midway into the cervical stroma, but not into the endocervical canal. The two ends of the suture were then tied securely anteriorly and cut, leaving the ends long enough to grasp with a clamp when it is time to remove it. There was minimal bleeding noted and the ring forceps were removed with good hemostasis noted.  All instruments were removed from the patient's vagina.  Instrument, needle and sponge counts were correct x 2. The patient tolerated the procedure well, and was taken to the recovery area awake and in stable condition.  The patient will be discharged to home as per PACU criteria.  Routine postoperative instructions given.  She was prescribed Percocet, Indocin and Colace.  She will follow up in the clinic in 2 weeks for postoperative evaluation and ongoing prenatal care.  Nyle Limb L. Harraway-Smith, M.D., [facility] CoreFACOG

## 2017-09-04 NOTE — Progress Notes (Signed)
Two RNs were unable to obtain FHR with monitor in Short Stay.

## 2017-09-04 NOTE — H&P (Signed)
Preoperative History and Physical  Melanie Harmon is a 26 y.o. [id number] here for surgical management of cervical insufficuency.  Now [redacted] weeks gestation   Proposed surgery: McDonald Cerclage  Past Medical History:  Diagnosis Date  . Fetal demise before 22 weeks with retention of dead fetus    x 2 - one at 18 wks 01/2016, one at 22 wks 11/2016  . GERD (gastroesophageal reflux disease)    with pregnancy only  . Headache    with pregnancy only - otc tylenol med prn  . Missed abortion    x 2 - TAB and MAB (D&C)   Past Surgical History:  Procedure Laterality Date  . DILATION AND CURETTAGE OF UTERUS     MAB  . Dilation and Curretage    . WISDOM TOOTH EXTRACTION     OB History    Gravida Para Term Preterm AB Living   5 1   1 3 1    SAB TAB Ectopic Multiple Live Births   2 1   0 1     Patient denies any cervical dysplasia or STIs. Medications Prior to Admission  Medication Sig Dispense Refill Last Dose  . acetaminophen (TYLENOL) 500 MG tablet Take 500 mg by mouth every 8 (eight) hours as needed for mild pain or headache.   Past Week at Unknown time  . pantoprazole (PROTONIX) 40 MG tablet Take 1 tablet (40 mg total) by mouth daily. 30 tablet 3 09/04/2017 at 0700  . Prenatal Vit-Fe Fumarate-FA (MULTIVITAMIN-PRENATAL) 27-0.8 MG TABS tablet Take 1 tablet by mouth daily at 12 noon.   09/03/2017 at Unknown time  . Doxylamine-Pyridoxine 10-10 MG TBEC Take 1 tablet by mouth 2 (two) times daily. (Patient not taking: Reported on 08/26/2017) 60 tablet 2 Not Taking at Unknown time  . zolpidem (AMBIEN) 5 MG tablet Take 1 tablet (5 mg total) by mouth at bedtime as needed for sleep. (Patient not taking: Reported on 12/13/2016) 14 tablet 1 Not Taking    No Known Allergies Social History:   reports that she quit smoking about 5 weeks ago. Her smoking use included cigarettes. She has a 7.00 pack-year smoking history. she has never used smokeless tobacco. She reports that she does not drink alcohol or use  drugs. Family History  Problem Relation Age of Onset  . Heart attack Maternal Grandmother   . Heart attack Maternal Grandfather     Review of Systems: Noncontributory  PHYSICAL EXAM: Blood pressure 126/80, pulse 94, temperature 98.1 F (36.7 C), temperature source Oral, resp. rate 16, last menstrual period 06/03/2017, SpO2 100 %, unknown if currently breastfeeding. General appearance - alert, well appearing, and in no distress Chest - clear to auscultation, no wheezes, rales or rhonchi, symmetric air entry Heart - normal rate and regular rhythm Abdomen - soft, nontender, nondistended, no masses or organomegaly Pelvic - examination not indicated Extremities - peripheral pulses normal, no pedal edema, no clubbing or cyanosis +FHR via US at bedside  Labs: Results for orders placed or performed during the hospital encounter of 09/03/17 (from the past 336 hour(s))  CBC   Collection Time: 09/03/17  9:20 AM  Result Value Ref Range   WBC 7.4 4.0 - 10.5 K/uL   RBC 4.51 3.87 - 5.11 MIL/uL   Hemoglobin 13.9 12.0 - 15.0 g/dL   HCT 98.141.0 19.136.0 - 47.846.0 %   MCV 90.9 78.0 - 100.0 fL   MCH 30.8 26.0 - 34.0 pg   MCHC 33.9 30.0 - 36.0 g/dL   RDW  12.8 11.5 - 15.5 %   Platelets 227 150 - 400 K/uL    Imaging Studies: No results found.  Assessment: Patient Active Problem List   Diagnosis Date Noted  . Supervision of other normal pregnancy, antepartum 07/18/2017  . Morbid obesity (HCC) 07/18/2017  . History of cervical incompetence in pregnancy, currently pregnant, unspecified trimester 11/25/2016  . Headaches due to old head injury 10/23/2016    Plan: Patient will undergo surgical management with McDonald Cerclage.   The risks of surgery were discussed in detail with the patient including but not limited to: bleeding which may require transfusion or reoperation; infection which may require antibiotics; injury to surrounding organs which may involve bowel, bladder, ureters ; need for additional  procedures including laparoscopy or laparotomy; thromboembolic phenomenon, surgical site problems and other postoperative/anesthesia complications. Likelihood of success in alleviating the patient's condition was discussed. Routine postoperative instructions will be reviewed with the patient and her family in detail after surgery.  The patient concurred with the proposed plan, giving informed written consent for the surgery.  Patient has been NPO since last night she will remain NPO for procedure.  Anesthesia and OR aware.  Preoperative prophylactic antibiotics and SCDs ordered on call to the OR.  To OR when ready.  Amaal Dimartino L. Erin FullingHarraway-Smith, M.D., [facility] 09/04/2017 10:16 AM

## 2017-09-04 NOTE — Anesthesia Preprocedure Evaluation (Addendum)
Anesthesia Evaluation  Patient identified by MRN, date of birth, ID band Patient awake    Reviewed: Allergy & Precautions, H&P , NPO status , Patient's Chart, lab work & pertinent test results, reviewed documented beta blocker date and time   Airway Mallampati: I  TM Distance: >3 FB Neck ROM: full    Dental no notable dental hx.    Pulmonary neg pulmonary ROS, former smoker,    Pulmonary exam normal breath sounds clear to auscultation       Cardiovascular negative cardio ROS Normal cardiovascular exam Rhythm:regular Rate:Normal     Neuro/Psych negative neurological ROS  negative psych ROS   GI/Hepatic negative GI ROS, Neg liver ROS, GERD  ,  Endo/Other  negative endocrine ROS  Renal/GU negative Renal ROS  negative genitourinary   Musculoskeletal   Abdominal   Peds  Hematology negative hematology ROS (+)   Anesthesia Other Findings   Reproductive/Obstetrics (+) Pregnancy                            Anesthesia Physical Anesthesia Plan  ASA: II  Anesthesia Plan: Spinal   Post-op Pain Management:    Induction:   PONV Risk Score and Plan:   Airway Management Planned:   Additional Equipment:   Intra-op Plan:   Post-operative Plan:   Informed Consent: I have reviewed the patients History and Physical, chart, labs and discussed the procedure including the risks, benefits and alternatives for the proposed anesthesia with the patient or authorized representative who has indicated his/her understanding and acceptance.     Plan Discussed with:   Anesthesia Plan Comments:         Anesthesia Quick Evaluation

## 2017-09-04 NOTE — Transfer of Care (Signed)
Immediate Anesthesia Transfer of Care Note  Patient: Melanie Harmon  Procedure(s) Performed: CERCLAGE CERVICAL (N/A Vagina )  Patient Location: PACU  Anesthesia Type:Spinal  Level of Consciousness: awake, alert  and oriented  Airway & Oxygen Therapy: Patient Spontanous Breathing  Post-op Assessment: Report given to RN and Post -op Vital signs reviewed and stable  Post vital signs: Reviewed and stable  Last Vitals:  Vitals:   09/04/17 0855  BP: 126/80  Pulse: 94  Resp: 16  Temp: 36.7 C  SpO2: 100%    Last Pain:  Vitals:   09/04/17 0855  TempSrc: Oral      Patients Stated Pain Goal: 3 (09/04/17 0855)  Complications: No apparent anesthesia complications

## 2017-09-04 NOTE — Discharge Instructions (Addendum)
Cervical Cerclage, Care After This sheet gives you information about how to care for yourself after your procedure. Your health care provider may also give you more specific instructions. If you have problems or questions, contact your health care provider. What can I expect after the procedure? After your procedure, it is common to have:  Cramping in your abdomen.  Mucus discharge for several days.  Painful urination (dysuria).  Small drops of blood coming from your vagina (spotting).  Follow these instructions at home:  Follow instructions from your health care provider about bed rest, if this applies. You may need to be on bed rest for up to 3 days.  Take over-the-counter and prescription medicines only as told by your health care provider.  Do not drive or use heavy machinery while taking prescription pain medicine.  Keep track of your vaginal discharge and watch for any changes. If you notice changes, tell your health care provider.  Avoid physical activities and exercise until your health care provider approves. Ask your health care provider what activities are safe for you.  Until your health care provider approves: ? Do not douche. ? Do not have sexual intercourse.  Keep all pre-birth (prenatal) visits and all follow-up visits as told by your health care provider. This is important. You will probably have weekly visits to have your cervix checked, and you may need an ultrasound. Contact a health care provider if:  You have abnormal or bad-smelling vaginal discharge, such as clots.  You develop a rash on your skin. This may look like redness and swelling.  You become light-headed or feel like you are going to faint.  You have abdominal pain that does not get better with medicine.  You have persistent nausea or vomiting. Get help right away if:  You have vaginal bleeding that is heavier or more frequent than spotting.  You are leaking fluid or have a gush of fluid  from your vagina (your water breaks).  You have a fever or chills.  You faint.  You have uterine contractions. These may feel like: ? A back ache. ? Lower abdominal pain. ? Mild cramps, similar to menstrual cramps. ? Tightening or pressure in your abdomen.  You think that your baby is not moving as much as usual, or you cannot feel your baby move.  You have chest pain.  You have shortness of breath. This information is not intended to replace advice given to you by your health care provider. Make sure you discuss any questions you have with your health care provider. Document Released: 07/07/2013 Document Revised: 05/15/2016 Document Reviewed: 04/19/2016 Elsevier Interactive Patient Education  2018 [employer]  Post Anesthesia Home Care Instructions  Activity: Get plenty of rest for the remainder of the day. A responsible individual must stay with you for 24 hours following the procedure.  For the next 24 hours, DO NOT: -Drive a car -Advertising copywriterperate machinery -Drink alcoholic beverages -Take any medication unless instructed by your physician -Make any legal decisions or sign important papers.

## 2017-09-04 NOTE — Anesthesia Postprocedure Evaluation (Signed)
Anesthesia Post Note  Patient: Melanie Harmon  Procedure(s) Performed: CERCLAGE CERVICAL (N/A Vagina )     Patient location during evaluation: PACU Anesthesia Type: Spinal Level of consciousness: oriented and awake and alert Pain management: pain level controlled Vital Signs Assessment: post-procedure vital signs reviewed and stable Respiratory status: spontaneous breathing, respiratory function stable and patient connected to nasal cannula oxygen Cardiovascular status: blood pressure returned to baseline and stable Postop Assessment: no headache, no backache and no apparent nausea or vomiting Anesthetic complications: no    Last Vitals:  Vitals:   09/04/17 1315 09/04/17 1330  BP: (!) 143/67   Pulse: (!) 108 97  Resp: (!) 9 15  Temp:    SpO2: 96% 97%    Last Pain:  Vitals:   09/04/17 1330  TempSrc:   PainSc: 0-No pain   Pain Goal: Patients Stated Pain Goal: 3 (09/04/17 1330)               Amillya Chavira

## 2017-09-04 NOTE — Brief Op Note (Signed)
09/04/2017  11:27 AM  PATIENT:  Melanie Harmon  26 y.o. female  PRE-OPERATIVE DIAGNOSIS:  Incompetent Cervix  POST-OPERATIVE DIAGNOSIS:  Incompetent Cervix  PROCEDURE:  Procedure(s): CERCLAGE CERVICAL (N/A)  SURGEON:  Surgeon(s) and Role:    * Willodean RosenthalHarraway-Smith, Sharnise Blough, MD - Primary  ANESTHESIA:   spinal  EBL:  40 mL   BLOOD ADMINISTERED:none  DRAINS: none   LOCAL MEDICATIONS USED:  MARCAINE     SPECIMEN:  No Specimen  DISPOSITION OF SPECIMEN:  PATHOLOGY  COUNTS:  YES  TOURNIQUET:  * No tourniquets in log *  DICTATION: .Note written in EPIC  PLAN OF CARE: Discharge to home after PACU  PATIENT DISPOSITION:  PACU - hemodynamically stable.   Delay start of Pharmacological VTE agent (>24hrs) due to surgical blood loss or risk of bleeding: not applicable  Complications: none immediate  Chelsey Kimberley L. Harraway-Smith, M.D., Evern CoreFACOG

## 2017-09-04 NOTE — Anesthesia Procedure Notes (Signed)
Spinal  Patient location during procedure: OR Start time: 09/04/2017 10:30 AM End time: 09/04/2017 10:37 AM Staffing Performed: other anesthesia staff  Preanesthetic Checklist Completed: patient identified, surgical consent, pre-op evaluation, timeout performed, IV checked, risks and benefits discussed and monitors and equipment checked Spinal Block Patient position: sitting Prep: ChloraPrep Patient monitoring: heart rate, cardiac monitor and blood pressure Approach: midline Location: L4-5 Injection technique: single-shot Needle Needle type: Pencan  Needle gauge: 24 G Assessment Sensory level: T4

## 2017-09-05 ENCOUNTER — Encounter (HOSPITAL_COMMUNITY): Payer: Self-pay | Admitting: Obstetrics & Gynecology

## 2017-09-15 ENCOUNTER — Encounter: Payer: Medicaid Other | Admitting: Family Medicine

## 2017-09-16 ENCOUNTER — Encounter: Payer: Self-pay | Admitting: Advanced Practice Midwife

## 2017-09-16 ENCOUNTER — Ambulatory Visit (INDEPENDENT_AMBULATORY_CARE_PROVIDER_SITE_OTHER): Payer: Medicaid Other | Admitting: Advanced Practice Midwife

## 2017-09-16 VITALS — BP 116/91 | HR 101 | Wt 306.0 lb

## 2017-09-16 DIAGNOSIS — O09299 Supervision of pregnancy with other poor reproductive or obstetric history, unspecified trimester: Secondary | ICD-10-CM

## 2017-09-16 DIAGNOSIS — O09292 Supervision of pregnancy with other poor reproductive or obstetric history, second trimester: Secondary | ICD-10-CM

## 2017-09-16 NOTE — Progress Notes (Signed)
   PRENATAL VISIT NOTE  Subjective:  Melanie Harmon is a 26 y.o. [id number] at 165w0d being seen today for ongoing prenatal care.  She is currently monitored for the following issues for this high-risk pregnancy and has Headaches due to old head injury; History of cervical incompetence in pregnancy, currently pregnant, unspecified trimester; Supervision of other normal pregnancy, antepartum; and Morbid obesity (HCC) on their problem list.  Patient reports no complaints.  Contractions: Not present. Vag. Bleeding: None.  Movement: Absent. Denies leaking of fluid.   She underwent a cerclage placement on 09/04/17 for history of incompetent cervix.  Has felt well and  Had no bleeding  The following portions of the patient's history were reviewed and updated as appropriate: allergies, current medications, past family history, past medical history, past social history, past surgical history and problem list. Problem list updated.  Objective:   Vitals:   09/16/17 0838  BP: (!) 116/91  Pulse: (!) 101  Weight: (!) 306 lb (138.8 kg)    Fetal Status:   Fundal Height: 15 cm Movement: Absent     General:  Alert, oriented and cooperative. Patient is in no acute distress.  Skin: Skin is warm and dry. No rash noted.   Cardiovascular: Normal heart rate noted  Respiratory: Normal respiratory effort, no problems with respiration noted  Abdomen: Soft, gravid, appropriate for gestational age.  Pain/Pressure: Absent     Pelvic: Cervical exam performed       Cerclage in place, Cervix closed and soft but has some length.  No bleeding  Extremities: Normal range of motion.  Edema: None  Mental Status:  Normal mood and affect. Normal behavior. Normal judgment and thought content.   Assessment and Plan:  Pregnancy: G5P0131 at 3265w0d  1. History of cervical incompetence in pregnancy, currently pregnant, unspecified trimester       S/P Cerclage placement       Discussed need to come in or go to [facility]U if she has any  unusual pressure, bleeding, even if it is spotting.  - US MFM OB DETAIL +14 WK; Future  Preterm labor symptoms and general obstetric precautions including but not limited to vaginal bleeding, contractions, leaking of fluid and fetal movement were reviewed in detail with the patient. Please refer to After Visit Summary for other counseling recommendations.  Return in about 2 weeks (around 09/30/2017) [facility]   Wynelle BourgeoisMarie Domenic Schoenberger, CNM

## 2017-09-16 NOTE — Patient Instructions (Signed)
Cervical Cerclage, Care After  This sheet gives you information about how to care for yourself after your procedure. Your health care provider may also give you more specific instructions. If you have problems or questions, contact your health care provider.  What can I expect after the procedure?  After your procedure, it is common to have:   Cramping in your abdomen.   Mucus discharge for several days.   Painful urination (dysuria).   Small drops of blood coming from your vagina (spotting).    Follow these instructions at home:   Follow instructions from your health care provider about bed rest, if this applies. You may need to be on bed rest for up to 3 days.   Take over-the-counter and prescription medicines only as told by your health care provider.   Do not drive or use heavy machinery while taking prescription pain medicine.   Keep track of your vaginal discharge and watch for any changes. If you notice changes, tell your health care provider.   Avoid physical activities and exercise until your health care provider approves. Ask your health care provider what activities are safe for you.   Until your health care provider approves:  ? Do not douche.  ? Do not have sexual intercourse.   Keep all pre-birth (prenatal) visits and all follow-up visits as told by your health care provider. This is important. You will probably have weekly visits to have your cervix checked, and you may need an ultrasound.    Contact a health care provider if:   You have abnormal or bad-smelling vaginal discharge, such as clots.   You develop a rash on your skin. This may look like redness and swelling.   You become light-headed or feel like you are going to faint.   You have abdominal pain that does not get better with medicine.   You have persistent nausea or vomiting.    Get help right away if:   You have vaginal bleeding that is heavier or more frequent than spotting.   You are leaking fluid or have a gush of  fluid from your vagina (your water breaks).   You have a fever or chills.   You faint.   You have uterine contractions. These may feel like:  ? A back ache.  ? Lower abdominal pain.  ? Mild cramps, similar to menstrual cramps.  ? Tightening or pressure in your abdomen.   You think that your baby is not moving as much as usual, or you cannot feel your baby move.   You have chest pain.   You have shortness of breath.  This information is not intended to replace advice given to you by your health care provider. Make sure you discuss any questions you have with your health care provider.    Document Released: 07/07/2013 Document Revised: 05/15/2016 Document Reviewed: 04/19/2016  Elsevier Interactive Patient Education  2018 Elsevier Inc.

## 2017-09-30 NOTE — L&D Delivery Note (Signed)
Patient is 27 y.o. Z6X0960 [redacted]w[redacted]d admitted for SROM. S/p augmentation of labor with Pitocin.  Prenatal course also complicated by cervical incompetence, intrapartum onset gHTN and chorioamniotis, and macrosomia.  Delivery Note At 1:22 AM a viable female was delivered via Vaginal, Spontaneous (Presentation: LOA).  APGAR: , ; weight 9 lb 8 oz (4310 g).   Placenta status: Intact.  Cord: 3V with the following complications: Nuchal x1; reduced after delivery.  Cord pH: 7.03  Anesthesia: Epidural Episiotomy:  None Lacerations:  1st degree Suture Repair: 3.0 vicryl Est. Blood Loss (mL):  350  Mom to postpartum.  Baby to NICU.  Shoulder and body delivered in usual fashion. Infant without spontaneous cry after stimulation. Cord clamped and infant given to awaiting NICU team for assessment. Cord blood drawn. Placenta delivered spontaneously with gentle cord traction. Fundus firm with massage and Pitocin.  Caryl Ada, DO 02/27/2018, 1:52 AM OB Fellow Center for [facility], [facility]

## 2017-10-02 ENCOUNTER — Ambulatory Visit (INDEPENDENT_AMBULATORY_CARE_PROVIDER_SITE_OTHER): Payer: Medicaid Other | Admitting: Family Medicine

## 2017-10-02 VITALS — BP 121/68 | HR 101 | Wt 310.0 lb

## 2017-10-02 DIAGNOSIS — O3430 Maternal care for cervical incompetence, unspecified trimester: Secondary | ICD-10-CM | POA: Insufficient documentation

## 2017-10-02 DIAGNOSIS — O09292 Supervision of pregnancy with other poor reproductive or obstetric history, second trimester: Secondary | ICD-10-CM

## 2017-10-02 DIAGNOSIS — O09299 Supervision of pregnancy with other poor reproductive or obstetric history, unspecified trimester: Secondary | ICD-10-CM

## 2017-10-02 DIAGNOSIS — O99212 Obesity complicating pregnancy, second trimester: Secondary | ICD-10-CM

## 2017-10-02 DIAGNOSIS — O3432 Maternal care for cervical incompetence, second trimester: Secondary | ICD-10-CM

## 2017-10-02 DIAGNOSIS — Z3480 Encounter for supervision of other normal pregnancy, unspecified trimester: Secondary | ICD-10-CM

## 2017-10-02 NOTE — Progress Notes (Signed)
   PRENATAL VISIT NOTE  Subjective:  Melanie Harmon is a 27 y.o. Z6X0960G5P0131 at 4829w2d being seen today for ongoing prenatal care.  She is currently monitored for the following issues for this high-risk pregnancy and has Headaches due to old head injury; History of cervical incompetence in pregnancy, currently pregnant, unspecified trimester; Supervision of other normal pregnancy, antepartum; Morbid obesity (HCC); and Cervical cerclage suture present, antepartum on their problem list.  Patient reports no complaints.  Contractions: Not present. Vag. Bleeding: None.  Movement: Present. Denies leaking of fluid.   The following portions of the patient's history were reviewed and updated as appropriate: allergies, current medications, past family history, past medical history, past social history, past surgical history and problem list. Problem list updated.  Objective:   Vitals:   10/02/17 0843  BP: 121/68  Pulse: (!) 101  Weight: (!) 310 lb (140.6 kg)    Fetal Status: Fetal Heart Rate (bpm): 140   Movement: Present     General:  Alert, oriented and cooperative. Patient is in no acute distress.  Skin: Skin is warm and dry. No rash noted.   Cardiovascular: Normal heart rate noted  Respiratory: Normal respiratory effort, no problems with respiration noted  Abdomen: Soft, gravid, appropriate for gestational age.  Pain/Pressure: Absent     Pelvic: Cervical exam deferred        Extremities: Normal range of motion.  Edema: None  Mental Status:  Normal mood and affect. Normal behavior. Normal judgment and thought content.   Assessment and Plan:  Pregnancy: G5P0131 at 6829w2d  1. Supervision of other normal pregnancy, antepartum FHT and FH normal. Detailed US in 2 weeks  2. Morbid obesity (HCC)  3. History of cervical incompetence in pregnancy, currently pregnant, unspecified trimester Cerclage in place. Will check cervical length next week.   4. Cervical cerclage suture present, antepartum - US  MFM OB LIMITED; Future  Preterm labor symptoms and general obstetric precautions including but not limited to vaginal bleeding, contractions, leaking of fluid and fetal movement were reviewed in detail with the patient. Please refer to After Visit Summary for other counseling recommendations.  Return in about 4 weeks (around 10/30/2017) for OB f/u.   Levie HeritageJacob J Stinson, DO

## 2017-10-07 ENCOUNTER — Encounter (HOSPITAL_COMMUNITY): Payer: Self-pay

## 2017-10-08 ENCOUNTER — Other Ambulatory Visit: Payer: Self-pay | Admitting: Family Medicine

## 2017-10-08 ENCOUNTER — Encounter (HOSPITAL_COMMUNITY): Payer: Self-pay

## 2017-10-08 ENCOUNTER — Ambulatory Visit (HOSPITAL_COMMUNITY)
Admission: RE | Admit: 2017-10-08 | Discharge: 2017-10-08 | Disposition: A | Discharge status: Home / Self Care | Payer: Medicaid Other | Source: Ambulatory Visit | Attending: Family Medicine | Admitting: Family Medicine

## 2017-10-08 DIAGNOSIS — Z3A18 18 weeks gestation of pregnancy: Secondary | ICD-10-CM | POA: Insufficient documentation

## 2017-10-08 DIAGNOSIS — O3430 Maternal care for cervical incompetence, unspecified trimester: Secondary | ICD-10-CM

## 2017-10-08 DIAGNOSIS — O3432 Maternal care for cervical incompetence, second trimester: Secondary | ICD-10-CM | POA: Diagnosis present

## 2017-10-08 DIAGNOSIS — Z3480 Encounter for supervision of other normal pregnancy, unspecified trimester: Secondary | ICD-10-CM

## 2017-10-08 DIAGNOSIS — O321XX0 Maternal care for breech presentation, not applicable or unspecified: Secondary | ICD-10-CM | POA: Diagnosis not present

## 2017-10-08 DIAGNOSIS — O99212 Obesity complicating pregnancy, second trimester: Secondary | ICD-10-CM | POA: Insufficient documentation

## 2017-10-08 DIAGNOSIS — O09292 Supervision of pregnancy with other poor reproductive or obstetric history, second trimester: Secondary | ICD-10-CM | POA: Diagnosis not present

## 2017-10-14 ENCOUNTER — Other Ambulatory Visit: Payer: Self-pay | Admitting: Advanced Practice Midwife

## 2017-10-14 ENCOUNTER — Ambulatory Visit (HOSPITAL_COMMUNITY)
Admission: RE | Admit: 2017-10-14 | Discharge: 2017-10-14 | Disposition: A | Discharge status: Home / Self Care | Payer: Medicaid Other | Source: Ambulatory Visit | Attending: Advanced Practice Midwife | Admitting: Advanced Practice Midwife

## 2017-10-14 ENCOUNTER — Encounter (HOSPITAL_COMMUNITY): Payer: Self-pay

## 2017-10-14 DIAGNOSIS — Z363 Encounter for antenatal screening for malformations: Secondary | ICD-10-CM | POA: Insufficient documentation

## 2017-10-14 DIAGNOSIS — Z3A19 19 weeks gestation of pregnancy: Secondary | ICD-10-CM

## 2017-10-14 DIAGNOSIS — O3432 Maternal care for cervical incompetence, second trimester: Secondary | ICD-10-CM | POA: Diagnosis not present

## 2017-10-14 DIAGNOSIS — O99212 Obesity complicating pregnancy, second trimester: Secondary | ICD-10-CM | POA: Insufficient documentation

## 2017-10-14 DIAGNOSIS — O99210 Obesity complicating pregnancy, unspecified trimester: Secondary | ICD-10-CM

## 2017-10-14 DIAGNOSIS — O321XX0 Maternal care for breech presentation, not applicable or unspecified: Secondary | ICD-10-CM | POA: Diagnosis not present

## 2017-10-14 DIAGNOSIS — O09299 Supervision of pregnancy with other poor reproductive or obstetric history, unspecified trimester: Secondary | ICD-10-CM

## 2017-10-14 DIAGNOSIS — Z3480 Encounter for supervision of other normal pregnancy, unspecified trimester: Secondary | ICD-10-CM

## 2017-10-15 ENCOUNTER — Other Ambulatory Visit (HOSPITAL_COMMUNITY): Payer: Self-pay | Admitting: *Deleted

## 2017-10-15 DIAGNOSIS — O3432 Maternal care for cervical incompetence, second trimester: Secondary | ICD-10-CM

## 2017-11-04 ENCOUNTER — Ambulatory Visit (INDEPENDENT_AMBULATORY_CARE_PROVIDER_SITE_OTHER): Payer: Medicaid Other | Admitting: Advanced Practice Midwife

## 2017-11-04 ENCOUNTER — Encounter: Payer: Self-pay | Admitting: Advanced Practice Midwife

## 2017-11-04 VITALS — BP 116/74 | HR 120 | Wt 314.0 lb

## 2017-11-04 DIAGNOSIS — O09892 Supervision of other high risk pregnancies, second trimester: Secondary | ICD-10-CM

## 2017-11-04 DIAGNOSIS — O3432 Maternal care for cervical incompetence, second trimester: Secondary | ICD-10-CM

## 2017-11-04 DIAGNOSIS — Z23 Encounter for immunization: Secondary | ICD-10-CM

## 2017-11-04 DIAGNOSIS — O09292 Supervision of pregnancy with other poor reproductive or obstetric history, second trimester: Secondary | ICD-10-CM

## 2017-11-04 DIAGNOSIS — O3430 Maternal care for cervical incompetence, unspecified trimester: Secondary | ICD-10-CM

## 2017-11-04 DIAGNOSIS — O09899 Supervision of other high risk pregnancies, unspecified trimester: Secondary | ICD-10-CM

## 2017-11-04 DIAGNOSIS — O09299 Supervision of pregnancy with other poor reproductive or obstetric history, unspecified trimester: Secondary | ICD-10-CM

## 2017-11-04 NOTE — Patient Instructions (Signed)
AREA PEDIATRIC/FAMILY PRACTICE PHYSICIANS  [facility] [address], [address] [place], [place]  [address] Phone - [phone]   Fax - [phone]  [facility] [address] [place], [place] [address] Phone - [phone]   Fax - [phone]  [facility] AMOS [address] [place], [place]  [address] Phone - [phone]   Fax - [phone]  [facility] [address] [place], [place]  [address] Phone - [phone]   Fax - [phone]  [facility] [address] [place], [place]  [address] Phone - [phone]   Fax - [phone]  [facility] [address], [address] [place], [place]  [address] Phone - [phone]   Fax - [phone]  [facility] [address] [place], [place]  [address] Phone - [phone]   Fax - [phone]  [facility] [address], [address] [place], [place]  [address] Phone - [phone]   Fax - [phone]  [facility] [address] [place], [place]  [address] Phone - [phone]   Fax - [phone] [facility] [address] [place], [place]  [address] Phone - [phone]   Fax - [phone]  [facility] [address] [place], [place]  [address] Phone - [phone]   Fax - [phone]  [facility] [address], [address] [place], [place]  [address] Phone - [phone]   Fax - [phone]  [facility] [address] [place], [place]  [address] Phone - [phone]   Fax - [phone]  [facility] [address], [address] [place], [place]  [address] Phone - [phone]  [facility] [address] [place], [place]  [address] Phone - [phone]   Fax - [phone]  [facility] [address], [address] [place], [place]  [address] Phone - [phone]   Fax - [phone]  [facility] [address] [place], [place]  [address] Phone - [phone]   Fax - [phone]  [facility] [address] [place], [place]  [address] Phone - [phone]   Fax - [phone]   [facility] [address] [place], [place]  [address] Phone - [phone]   Fax - [phone]  [facility] [address], [address] [place], [place]  [address] Phone - [phone]   Fax - [phone]  DAVID RUBIN [address], [address] [place], [place]  [address] Phone - [phone]   Fax - [phone]  [facility] [address] [place], [place]  [address] Phone - [phone]   Fax - [phone]  [facility] [address] [place], [place]  [address] Phone - [phone]   Fax - [phone] [facility] [place] [address] [place], [place]  [address] Phone - [phone]   Fax - [phone]  [facility] [address] [place], [place]  [address] Phone - [phone]   Fax - [phone]  [facility] [place] [address] [place], [place]  [address] Phone - [phone]   Fax - [phone]  [facility] Wyvonne Lenzharlene Flemming MD [address] [place] [address] Phone [phone]  Fax [phone]   Pregnancy and Influenza Influenza, also called the flu, is an infection of the respiratory  tract. If you are pregnant, you are more likely to catch the flu. You are also more likely to have a more serious case of the flu. This is because pregnancy lowers your body's ability to fight off infections (it weakens your immune system). It also puts additional stress on your heart and lungs, which makes you more likely to have complications. Having a bad case of the flu, especially with a high  fever, can be dangerous for your developing baby. It can cause you to go into early labor. How do people get the flu? The flu is caused by the influenza virus. This virus is common every year in the fall and winter. It spreads when virus particles get passed from person to person. You can get the virus if you are near a sick person who is coughing or sneezing. You can also get the virus if you touch something that has the virus on it and then touch your face. How can I protect myself against the flu?  Get a flu shot. The best way to prevent the flu is to get a flu shot before flu season starts. The flu shot is not dangerous for your developing baby. It may even help protect your baby from the flu for up to 6 months after birth. The flu shot is one type of flu vaccine. Another type is a nasal spray vaccine. Do not get the nasal spray vaccine. It is not approved for pregnancy.  Do not come in close contact with sick people.  Do not share food, drinks, or utensils with other people.  Wash your hands often. Use hand sanitizer when soap and water are not available. What should I do if I have flu symptoms? If you have any flu symptoms, call your health care provider right away. Flu symptoms include:  Fever or chills.  Muscle aches.  Headache.  Sore throat.  Nasal congestion.  Cough.  Feeling tired.  Loss of appetite.  Vomiting.  Diarrhea.  You may be able to take an antiviral medicine to keep the flu from becoming severe and to shorten how long it lasts. What should I do at home if I am diagnosed with the flu?  Do not take any medicine, including cold or flu medicine, unless directed by your health care provider.  If you take antiviral medicine, make sure you finish it even if you start to feel better.  Drink enough fluid to keep your urine clear or pale yellow.  Get plenty of rest. When would I seek immediate medical care if I have the flu?  You have trouble  breathing.  You have chest pain.  You begin to have labor pains.  You have a high fever that does not go down after you take medicine.  You do not feel your baby move.  You have diarrhea or vomiting that will not go away. This information is not intended to replace advice given to you by your health care provider. Make sure you discuss any questions you have with your health care provider. Document Released: 07/19/2008 Document Revised: 02/22/2016 Document Reviewed: 08/13/2013 Elsevier Interactive Patient Education  2017 [employer].

## 2017-11-04 NOTE — Progress Notes (Signed)
   PRENATAL VISIT NOTE  Subjective:  Melanie Harmon is a 27 y.o. [name] at 845w0d being seen today for ongoing prenatal care.  She is currently monitored for the following issues for this high-risk pregnancy and has Headaches due to old head injury; History of cervical incompetence in pregnancy, currently pregnant, unspecified trimester; Supervision of other normal pregnancy, antepartum; Morbid obesity (HCC); and Cervical cerclage suture present, antepartum on their problem list.  Patient reports no complaints.  Contractions: Not present. Vag. Bleeding: None.  Movement: Present. Denies leaking of fluid.   The following portions of the patient's history were reviewed and updated as appropriate: allergies, current medications, past family history, past medical history, past social history, past surgical history and problem list. Problem list updated.  Objective:   Vitals:   11/04/17 0818  BP: 116/74  Pulse: (!) 120  Weight: (!) 314 lb (142.4 kg)    Fetal Status: Fetal Heart Rate (bpm): 145   Movement: Present     General:  Alert, oriented and cooperative. Patient is in no acute distress.  Skin: Skin is warm and dry. No rash noted.   Cardiovascular: Normal heart rate noted  Respiratory: Normal respiratory effort, no problems with respiration noted  Abdomen: Soft, gravid, appropriate for gestational age.  Pain/Pressure: Absent     Pelvic: Cervical exam deferred        Extremities: Normal range of motion.     Mental Status:  Normal mood and affect. Normal behavior. Normal judgment and thought content.   Assessment and Plan:  Pregnancy: [name] at 385w0d  1. Supervision of other high risk pregnancy, antepartum - Flu vaccine today  2. Cervical cerclage suture present, antepartum - Kore[place] for CL and incomplete anatomy 2/13  3. History of cervical incompetence in pregnancy, currently pregnant, unspecified trimester   Preterm labor symptoms and general obstetric precautions including but not  limited to vaginal bleeding, contractions, leaking of fluid and fetal movement were reviewed in detail with the patient. Please refer to After Visit Summary for other counseling recommendations.  Return in about 4 weeks (around 12/02/2017) for ROB/GTT.   Dorathy KinsmanVirginia Liddy Deam, CNM

## 2017-11-12 ENCOUNTER — Encounter (HOSPITAL_COMMUNITY): Payer: Self-pay

## 2017-11-12 ENCOUNTER — Other Ambulatory Visit (HOSPITAL_COMMUNITY): Payer: Self-pay | Admitting: Obstetrics and Gynecology

## 2017-11-12 ENCOUNTER — Ambulatory Visit (HOSPITAL_COMMUNITY)
Admission: RE | Admit: 2017-11-12 | Discharge: 2017-11-12 | Disposition: A | Discharge status: Home / Self Care | Payer: Medicaid Other | Source: Ambulatory Visit | Attending: Advanced Practice Midwife | Admitting: Advanced Practice Midwife

## 2017-11-12 DIAGNOSIS — Z3A23 23 weeks gestation of pregnancy: Secondary | ICD-10-CM

## 2017-11-12 DIAGNOSIS — O3432 Maternal care for cervical incompetence, second trimester: Secondary | ICD-10-CM | POA: Diagnosis present

## 2017-11-12 DIAGNOSIS — Z6841 Body Mass Index (BMI) 40.0 and over, adult: Secondary | ICD-10-CM

## 2017-11-12 DIAGNOSIS — Z362 Encounter for other antenatal screening follow-up: Secondary | ICD-10-CM

## 2017-11-12 DIAGNOSIS — Z3686 Encounter for antenatal screening for cervical length: Secondary | ICD-10-CM | POA: Diagnosis not present

## 2017-11-12 DIAGNOSIS — O09299 Supervision of pregnancy with other poor reproductive or obstetric history, unspecified trimester: Secondary | ICD-10-CM

## 2017-11-12 DIAGNOSIS — O09899 Supervision of other high risk pregnancies, unspecified trimester: Secondary | ICD-10-CM

## 2017-11-12 DIAGNOSIS — O99212 Obesity complicating pregnancy, second trimester: Secondary | ICD-10-CM | POA: Insufficient documentation

## 2017-11-13 ENCOUNTER — Other Ambulatory Visit (HOSPITAL_COMMUNITY): Payer: Self-pay | Admitting: *Deleted

## 2017-11-13 DIAGNOSIS — Z362 Encounter for other antenatal screening follow-up: Secondary | ICD-10-CM

## 2017-11-13 DIAGNOSIS — O3430 Maternal care for cervical incompetence, unspecified trimester: Secondary | ICD-10-CM

## 2017-11-27 ENCOUNTER — Ambulatory Visit (HOSPITAL_COMMUNITY)
Admission: RE | Admit: 2017-11-27 | Discharge: 2017-11-27 | Disposition: A | Discharge status: Home / Self Care | Payer: Medicaid Other | Source: Ambulatory Visit | Attending: Advanced Practice Midwife | Admitting: Advanced Practice Midwife

## 2017-11-27 ENCOUNTER — Encounter (HOSPITAL_COMMUNITY): Payer: Self-pay

## 2017-11-27 ENCOUNTER — Other Ambulatory Visit (HOSPITAL_COMMUNITY): Payer: Self-pay | Admitting: Obstetrics and Gynecology

## 2017-11-27 DIAGNOSIS — O99212 Obesity complicating pregnancy, second trimester: Secondary | ICD-10-CM | POA: Diagnosis present

## 2017-11-27 DIAGNOSIS — O3430 Maternal care for cervical incompetence, unspecified trimester: Secondary | ICD-10-CM

## 2017-11-27 DIAGNOSIS — Z3686 Encounter for antenatal screening for cervical length: Secondary | ICD-10-CM

## 2017-11-27 DIAGNOSIS — O3432 Maternal care for cervical incompetence, second trimester: Secondary | ICD-10-CM | POA: Insufficient documentation

## 2017-11-27 DIAGNOSIS — Z3A25 25 weeks gestation of pregnancy: Secondary | ICD-10-CM | POA: Diagnosis not present

## 2017-11-27 DIAGNOSIS — O09292 Supervision of pregnancy with other poor reproductive or obstetric history, second trimester: Secondary | ICD-10-CM | POA: Diagnosis not present

## 2017-11-27 DIAGNOSIS — O09899 Supervision of other high risk pregnancies, unspecified trimester: Secondary | ICD-10-CM

## 2017-12-02 ENCOUNTER — Encounter: Payer: Medicaid Other | Admitting: Advanced Practice Midwife

## 2017-12-04 ENCOUNTER — Ambulatory Visit (INDEPENDENT_AMBULATORY_CARE_PROVIDER_SITE_OTHER): Payer: Medicaid Other | Admitting: Family Medicine

## 2017-12-04 VITALS — BP 116/68 | HR 96 | Wt 317.0 lb

## 2017-12-04 DIAGNOSIS — O09899 Supervision of other high risk pregnancies, unspecified trimester: Secondary | ICD-10-CM

## 2017-12-04 DIAGNOSIS — Z3490 Encounter for supervision of normal pregnancy, unspecified, unspecified trimester: Secondary | ICD-10-CM

## 2017-12-04 DIAGNOSIS — O09299 Supervision of pregnancy with other poor reproductive or obstetric history, unspecified trimester: Secondary | ICD-10-CM

## 2017-12-04 DIAGNOSIS — O3430 Maternal care for cervical incompetence, unspecified trimester: Secondary | ICD-10-CM

## 2017-12-04 DIAGNOSIS — Z23 Encounter for immunization: Secondary | ICD-10-CM | POA: Diagnosis not present

## 2017-12-04 MED ORDER — PANTOPRAZOLE SODIUM 40 MG PO TBEC: 40.0000 mg | DELAYED_RELEASE_TABLET | Freq: Every day | ORAL | 3 refills | Status: DC

## 2017-12-04 NOTE — Progress Notes (Signed)
   PRENATAL VISIT NOTE  Subjective:  Melanie Harmon is a 27 y.o. [id number] [name] at 7457w2d being seen today for ongoing prenatal care.  She is currently monitored for the following issues for this high-risk pregnancy and has Headaches due to old head injury; History of cervical incompetence in pregnancy, currently pregnant, unspecified trimester; Supervision of other high risk pregnancy, antepartum; Morbid obesity (HCC); and Cervical cerclage suture present, antepartum on their problem list.  Patient reports no bleeding, no contractions, no cramping and no leaking.  Contractions: Not present. Vag. Bleeding: None.  Movement: Present. Denies leaking of fluid.   The following portions of the patient's history were reviewed and updated as appropriate: allergies, current medications, past family history, past medical history, past social history, past surgical history and problem list. Problem list updated.  Objective:   Vitals:   12/04/17 0812  BP: 116/68  Pulse: 96  Weight: (!) 317 lb (143.8 kg)    Fetal Status: Fetal Heart Rate (bpm): 140   Movement: Present     General:  Alert, oriented and cooperative. Patient is in no acute distress.  Skin: Skin is warm and dry. No rash noted.   Cardiovascular: Normal heart rate noted  Respiratory: Normal respiratory effort, no problems with respiration noted  Abdomen: Soft, gravid, appropriate for gestational age.  Pain/Pressure: Absent     Pelvic: Cervical exam deferred        Extremities: Normal range of motion.  Edema: None  Mental Status:  Normal mood and affect. Normal behavior. Normal judgment and thought content.   Assessment and Plan:  Pregnancy: [name] at 8757w2d  1. Supervision of other high risk pregnancy, antepartum FHT normal. F/u US for growth on 3/14.  2. Prenatal care, antepartum - Glucose Tolerance, 2 Hours w/1 Hour - RPR - HIV antibody (with reflex) - CBC  3. History of cervical incompetence in pregnancy, currently pregnant,  unspecified trimester Cercalge in place.  4. Cervical cerclage suture present, antepartum  5. Morbid obesity (HCC)   Preterm labor symptoms and general obstetric precautions including but not limited to vaginal bleeding, contractions, leaking of fluid and fetal movement were reviewed in detail with the patient. Please refer to After Visit Summary for other counseling recommendations.  Return in about 4 weeks (around 01/01/2018) for OB f/u.   Levie HeritageJacob J Stinson, DO

## 2017-12-05 ENCOUNTER — Encounter: Payer: Self-pay | Admitting: Family Medicine

## 2017-12-05 LAB — HIV ANTIBODY (ROUTINE TESTING W REFLEX): HIV SCREEN 4TH GENERATION: NONREACTIVE

## 2017-12-05 LAB — CBC
HEMOGLOBIN: 12.1 g/dL (ref 11.1–15.9)
Hematocrit: 36.4 % (ref 34.0–46.6)
MCH: 30 pg (ref 26.6–33.0)
MCHC: 33.2 g/dL (ref 31.5–35.7)
MCV: 90 fL (ref 79–97)
Platelets: 245 10*3/uL (ref 150–379)
RBC: 4.04 x10E6/uL (ref 3.77–5.28)
RDW: 13.2 % (ref 12.3–15.4)
WBC: 7.1 10*3/uL (ref 3.4–10.8)

## 2017-12-05 LAB — GLUCOSE TOLERANCE, 2 HOURS W/ 1HR
GLUCOSE, 1 HOUR: 111 mg/dL (ref 65–179)
GLUCOSE, 2 HOUR: 103 mg/dL (ref 65–152)
Glucose, Fasting: 84 mg/dL (ref 65–91)

## 2017-12-05 LAB — RPR: RPR: NONREACTIVE

## 2017-12-11 ENCOUNTER — Encounter (HOSPITAL_COMMUNITY): Payer: Self-pay

## 2017-12-11 ENCOUNTER — Other Ambulatory Visit (HOSPITAL_COMMUNITY): Payer: Self-pay | Admitting: Obstetrics and Gynecology

## 2017-12-11 ENCOUNTER — Ambulatory Visit (HOSPITAL_COMMUNITY)
Admission: RE | Admit: 2017-12-11 | Discharge: 2017-12-11 | Disposition: A | Discharge status: Home / Self Care | Payer: Medicaid Other | Source: Ambulatory Visit | Attending: Advanced Practice Midwife | Admitting: Advanced Practice Midwife

## 2017-12-11 DIAGNOSIS — O3432 Maternal care for cervical incompetence, second trimester: Secondary | ICD-10-CM | POA: Insufficient documentation

## 2017-12-11 DIAGNOSIS — Z362 Encounter for other antenatal screening follow-up: Secondary | ICD-10-CM | POA: Diagnosis not present

## 2017-12-11 DIAGNOSIS — O09212 Supervision of pregnancy with history of pre-term labor, second trimester: Secondary | ICD-10-CM | POA: Insufficient documentation

## 2017-12-11 DIAGNOSIS — Z3A27 27 weeks gestation of pregnancy: Secondary | ICD-10-CM

## 2017-12-11 DIAGNOSIS — O99213 Obesity complicating pregnancy, third trimester: Secondary | ICD-10-CM | POA: Insufficient documentation

## 2017-12-11 DIAGNOSIS — O09899 Supervision of other high risk pregnancies, unspecified trimester: Secondary | ICD-10-CM

## 2017-12-12 ENCOUNTER — Other Ambulatory Visit (HOSPITAL_COMMUNITY): Payer: Self-pay | Admitting: *Deleted

## 2017-12-12 DIAGNOSIS — O3433 Maternal care for cervical incompetence, third trimester: Secondary | ICD-10-CM

## 2018-01-01 ENCOUNTER — Encounter: Payer: Self-pay | Admitting: Family Medicine

## 2018-01-01 ENCOUNTER — Ambulatory Visit (INDEPENDENT_AMBULATORY_CARE_PROVIDER_SITE_OTHER): Payer: Medicaid Other | Admitting: Family Medicine

## 2018-01-01 VITALS — BP 118/83 | HR 92 | Wt 326.0 lb

## 2018-01-01 DIAGNOSIS — O3433 Maternal care for cervical incompetence, third trimester: Secondary | ICD-10-CM

## 2018-01-01 DIAGNOSIS — O09899 Supervision of other high risk pregnancies, unspecified trimester: Secondary | ICD-10-CM

## 2018-01-01 DIAGNOSIS — O3430 Maternal care for cervical incompetence, unspecified trimester: Secondary | ICD-10-CM

## 2018-01-01 DIAGNOSIS — O09893 Supervision of other high risk pregnancies, third trimester: Secondary | ICD-10-CM

## 2018-01-01 DIAGNOSIS — O99213 Obesity complicating pregnancy, third trimester: Secondary | ICD-10-CM

## 2018-01-01 DIAGNOSIS — O09299 Supervision of pregnancy with other poor reproductive or obstetric history, unspecified trimester: Secondary | ICD-10-CM

## 2018-01-01 DIAGNOSIS — O09293 Supervision of pregnancy with other poor reproductive or obstetric history, third trimester: Secondary | ICD-10-CM

## 2018-01-01 NOTE — Progress Notes (Signed)
   PRENATAL VISIT NOTE  Subjective:  Melanie Harmon is a 27 y.o. [name] at 5911w2d being seen today for ongoing prenatal care.  She is currently monitored for the following issues for this high-risk pregnancy and has Headaches due to old head injury; History of cervical incompetence in pregnancy, currently pregnant, unspecified trimester; Supervision of other high risk pregnancy, antepartum; Morbid obesity (HCC); and Cervical cerclage suture present, antepartum on their problem list.  Patient reports heartburn and edema.  Contractions: Not present. Vag. Bleeding: None.  Movement: Present. Denies leaking of fluid.   The following portions of the patient's history were reviewed and updated as appropriate: allergies, current medications, past family history, past medical history, past social history, past surgical history and problem list. Problem list updated.  Objective:   Vitals:   01/01/18 1012  BP: 118/83  Pulse: 92  Weight: (!) 326 lb (147.9 kg)    Fetal Status:     Movement: Present     General:  Alert, oriented and cooperative. Patient is in no acute distress.  Skin: Skin is warm and dry. No rash noted.   Cardiovascular: Normal heart rate noted  Respiratory: Normal respiratory effort, no problems with respiration noted  Abdomen: Soft, gravid, appropriate for gestational age.  Pain/Pressure: Absent     Pelvic: Cervical exam deferred        Extremities: Normal range of motion.  Edema: None  Mental Status: Normal mood and affect. Normal behavior. Normal judgment and thought content.   Assessment and Plan:  Pregnancy: [name] at 4011w2d  1. Supervision of other high risk pregnancy, antepartum FHT normal Has F/U US for growth in 3 weekds  2. History of cervical incompetence in pregnancy, currently pregnant, unspecified trimester No evidence of PTL  3. Cervical cerclage suture present, antepartum Remove 36-37 weeks  4. Morbid obesity (HCC)   Preterm labor symptoms and general  obstetric precautions including but not limited to vaginal bleeding, contractions, leaking of fluid and fetal movement were reviewed in detail with the patient. Please refer to After Visit Summary for other counseling recommendations.  No follow-ups on file.  Future Appointments  Date Time Provider Department Center  01/22/2018 12:45 PM [facility] US 5 [facility]    Levie HeritageJacob J Tonita Bills, DO

## 2018-01-15 ENCOUNTER — Ambulatory Visit (INDEPENDENT_AMBULATORY_CARE_PROVIDER_SITE_OTHER): Payer: Medicaid Other | Admitting: Obstetrics & Gynecology

## 2018-01-15 VITALS — BP 94/78 | HR 131 | Wt 327.0 lb

## 2018-01-15 DIAGNOSIS — O09293 Supervision of pregnancy with other poor reproductive or obstetric history, third trimester: Secondary | ICD-10-CM

## 2018-01-15 DIAGNOSIS — O09893 Supervision of other high risk pregnancies, third trimester: Secondary | ICD-10-CM

## 2018-01-15 DIAGNOSIS — O99213 Obesity complicating pregnancy, third trimester: Secondary | ICD-10-CM

## 2018-01-15 DIAGNOSIS — G44309 Post-traumatic headache, unspecified, not intractable: Secondary | ICD-10-CM

## 2018-01-15 DIAGNOSIS — O3430 Maternal care for cervical incompetence, unspecified trimester: Secondary | ICD-10-CM

## 2018-01-15 DIAGNOSIS — O09299 Supervision of pregnancy with other poor reproductive or obstetric history, unspecified trimester: Secondary | ICD-10-CM

## 2018-01-15 DIAGNOSIS — O09899 Supervision of other high risk pregnancies, unspecified trimester: Secondary | ICD-10-CM

## 2018-01-15 DIAGNOSIS — S0990XS Unspecified injury of head, sequela: Secondary | ICD-10-CM

## 2018-01-15 DIAGNOSIS — O3433 Maternal care for cervical incompetence, third trimester: Secondary | ICD-10-CM

## 2018-01-15 NOTE — Progress Notes (Signed)
   PRENATAL VISIT NOTE  Subjective:  Melanie Harmon is a 27 y.o. [id number] at [redacted]w[redacted]d being seen today for ongoing prenatal care.  She is currently monitored for the following issues for this low-risk pregnancy and has Headaches due to old head injury; History of cervical incompetence in pregnancy, currently pregnant, unspecified trimester; Supervision of other high risk pregnancy, antepartum; Morbid obesity (HCC); and Cervical cerclage suture present, antepartum on their problem list.  Patient reports bilateral LE edema.  Contractions: Not present. Vag. Bleeding: None.  Movement: Present. Denies leaking of fluid.   The following portions of the patient's history were reviewed and updated as appropriate: allergies, current medications, past family history, past medical history, past social history, past surgical history and problem list. Problem list updated.  Objective:   Vitals:   01/15/18 1053  Weight: (!) 327 lb (148.3 kg)    Fetal Status:     Movement: Present     General:  Alert, oriented and cooperative. Patient is in no acute distress.  Skin: Skin is warm and dry. No rash noted.   Cardiovascular: Normal heart rate noted  Respiratory: Normal respiratory effort, no problems with respiration noted  Abdomen: Soft, gravid, appropriate for gestational age.  Pain/Pressure: Present     Pelvic: Cervical exam deferred        Extremities: Normal range of motion.  Edema: Mild pitting, slight indentation  Mental Status: Normal mood and affect. Normal behavior. Normal judgment and thought content.   Assessment and Plan:  Pregnancy: [name] at [redacted]w[redacted]d  1. Supervision of other high risk pregnancy, antepartum  2. Morbid obesity (HCC) S>D has [place] scheduled in 1 week for growth    3. History of cervical incompetence in pregnancy, currently pregnant, unspecified trimester Cerclage removal at 36 weeks   4. Headaches due to old head injury  5. Cervical cerclage suture present, antepartum See  above  Preterm labor symptoms and general obstetric precautions including but not limited to vaginal bleeding, contractions, leaking of fluid and fetal movement were reviewed in detail with the patient. Please refer to After Visit Summary for other counseling recommendations.  Return in about 2 weeks (around 01/29/2018).  Future Appointments  Date Time Provider Department Center  01/22/2018 12:45 PM [facility] [place] 5 [facility]-US    Willodean Rosenthal, MD

## 2018-01-15 NOTE — Patient Instructions (Signed)

## 2018-01-21 ENCOUNTER — Encounter (HOSPITAL_COMMUNITY): Payer: Self-pay

## 2018-01-22 ENCOUNTER — Other Ambulatory Visit (HOSPITAL_COMMUNITY): Payer: Self-pay | Admitting: *Deleted

## 2018-01-22 ENCOUNTER — Encounter (HOSPITAL_COMMUNITY): Payer: Self-pay

## 2018-01-22 ENCOUNTER — Other Ambulatory Visit (HOSPITAL_COMMUNITY): Payer: Self-pay | Admitting: Maternal and Fetal Medicine

## 2018-01-22 ENCOUNTER — Ambulatory Visit (HOSPITAL_COMMUNITY)
Admission: RE | Admit: 2018-01-22 | Discharge: 2018-01-22 | Disposition: A | Discharge status: Home / Self Care | Payer: Medicaid Other | Source: Ambulatory Visit | Attending: Advanced Practice Midwife | Admitting: Advanced Practice Midwife

## 2018-01-22 DIAGNOSIS — O3433 Maternal care for cervical incompetence, third trimester: Secondary | ICD-10-CM | POA: Insufficient documentation

## 2018-01-22 DIAGNOSIS — Z362 Encounter for other antenatal screening follow-up: Secondary | ICD-10-CM | POA: Insufficient documentation

## 2018-01-22 DIAGNOSIS — O26843 Uterine size-date discrepancy, third trimester: Secondary | ICD-10-CM | POA: Insufficient documentation

## 2018-01-22 DIAGNOSIS — O09293 Supervision of pregnancy with other poor reproductive or obstetric history, third trimester: Secondary | ICD-10-CM | POA: Insufficient documentation

## 2018-01-22 DIAGNOSIS — O99213 Obesity complicating pregnancy, third trimester: Secondary | ICD-10-CM | POA: Diagnosis not present

## 2018-01-22 DIAGNOSIS — Z3A33 33 weeks gestation of pregnancy: Secondary | ICD-10-CM | POA: Diagnosis not present

## 2018-01-22 DIAGNOSIS — O3430 Maternal care for cervical incompetence, unspecified trimester: Secondary | ICD-10-CM

## 2018-01-29 ENCOUNTER — Encounter: Payer: Self-pay | Admitting: Family Medicine

## 2018-01-29 ENCOUNTER — Ambulatory Visit (INDEPENDENT_AMBULATORY_CARE_PROVIDER_SITE_OTHER): Payer: Medicaid Other | Admitting: Family Medicine

## 2018-01-29 VITALS — BP 117/86 | HR 118 | Wt 334.0 lb

## 2018-01-29 DIAGNOSIS — O3430 Maternal care for cervical incompetence, unspecified trimester: Secondary | ICD-10-CM

## 2018-01-29 DIAGNOSIS — O09293 Supervision of pregnancy with other poor reproductive or obstetric history, third trimester: Secondary | ICD-10-CM

## 2018-01-29 DIAGNOSIS — O3433 Maternal care for cervical incompetence, third trimester: Secondary | ICD-10-CM

## 2018-01-29 DIAGNOSIS — O09299 Supervision of pregnancy with other poor reproductive or obstetric history, unspecified trimester: Secondary | ICD-10-CM

## 2018-01-29 DIAGNOSIS — O09899 Supervision of other high risk pregnancies, unspecified trimester: Secondary | ICD-10-CM

## 2018-01-29 DIAGNOSIS — O09893 Supervision of other high risk pregnancies, third trimester: Secondary | ICD-10-CM

## 2018-01-29 NOTE — Progress Notes (Signed)
   PRENATAL VISIT NOTE  Subjective:  Melanie RousKorrina Zern72 y.o. [id number] at [redacted]w[redacted]d being seen today for ongoing prenatal care.  She is currently monitored for the following issues for this high-risk pregnancy and has Headaches due to old head injury; History of cervical incompetence in pregnancy, currently pregnant, unspecified trimester; Supervision of other high risk pregnancy, antepartum; Morbid obesity (HCC); and Cervical cerclage suture present, antepartum on their problem list.  Patient reports occasional twinge of pain towards the end of the day. No bleeding or spotting..  Contractions: Not present. Vag. Bleeding: None.  Movement: Present. Denies leaking of fluid.   The following portions of the patient's history were reviewed and updated as appropriate: allergies, current medications, past family history, past medical history, past social history, past surgical history and problem list. Problem list updated.  Objective:   Vitals:   01/29/18 1053  BP: 117/86  Pulse: (!) 118  Weight: (!) 334 lb (151.5 kg)    Fetal Status: Fetal Heart Rate (bpm): 143 Fundal Height: 39 cm Movement: Present     General:  Alert, oriented and cooperative. Patient is in no acute distress.  Skin: Skin is warm and dry. No rash noted.   Cardiovascular: Normal heart rate noted  Respiratory: Normal respiratory effort, no problems with respiration noted  Abdomen: Soft, gravid, appropriate for gestational age.  Pain/Pressure: Present     Pelvic: Cervical exam deferred        Extremities: Normal range of motion.  Edema: Mild pitting, slight indentation  Mental Status: Normal mood and affect. Normal behavior. Normal judgment and thought content.   Assessment and Plan:  Pregnancy: [name] at [redacted]w[redacted]d  1. Supervision of other high risk pregnancy, antepartum LGA baby on [place]. Rpt [place] in 4 weeks.  2. Cervical cerclage suture present, antepartum Remove at 36 wks  3. History of cervical incompetence in pregnancy,  currently pregnant, unspecified trimester   4. Morbid obesity (HCC)   Preterm labor symptoms and general obstetric precautions including but not limited to vaginal bleeding, contractions, leaking of fluid and fetal movement were reviewed in detail with the patient. Please refer to After Visit Summary for other counseling recommendations.  Return in about 2 weeks (around 02/12/2018) for OB f/u.  Future Appointments  Date Time Provider Department Center  02/19/2018  1:00 PM [facility] [place] 3 [facility]-US    Levie Heritage, DO

## 2018-01-29 NOTE — Progress Notes (Signed)
Patient feels twinge every once in a while but no bleeding or discharge. Armandina Stammer RN

## 2018-02-13 ENCOUNTER — Ambulatory Visit (INDEPENDENT_AMBULATORY_CARE_PROVIDER_SITE_OTHER): Payer: Medicaid Other | Admitting: Family Medicine

## 2018-02-13 ENCOUNTER — Other Ambulatory Visit (HOSPITAL_COMMUNITY)
Admission: RE | Admit: 2018-02-13 | Discharge: 2018-02-13 | Disposition: A | Discharge status: Home / Self Care | Payer: Medicaid Other | Source: Ambulatory Visit | Attending: Family Medicine | Admitting: Family Medicine

## 2018-02-13 VITALS — BP 117/62 | HR 103 | Wt 337.8 lb

## 2018-02-13 DIAGNOSIS — O0990 Supervision of high risk pregnancy, unspecified, unspecified trimester: Secondary | ICD-10-CM

## 2018-02-13 DIAGNOSIS — Z3A00 Weeks of gestation of pregnancy not specified: Secondary | ICD-10-CM | POA: Insufficient documentation

## 2018-02-13 DIAGNOSIS — O3433 Maternal care for cervical incompetence, third trimester: Secondary | ICD-10-CM

## 2018-02-13 DIAGNOSIS — O99213 Obesity complicating pregnancy, third trimester: Secondary | ICD-10-CM

## 2018-02-13 DIAGNOSIS — O0993 Supervision of high risk pregnancy, unspecified, third trimester: Secondary | ICD-10-CM

## 2018-02-13 DIAGNOSIS — O3430 Maternal care for cervical incompetence, unspecified trimester: Secondary | ICD-10-CM

## 2018-02-13 DIAGNOSIS — N883 Incompetence of cervix uteri: Secondary | ICD-10-CM

## 2018-02-13 LAB — OB RESULTS CONSOLE GBS: STREP GROUP B AG: NEGATIVE

## 2018-02-13 LAB — OB RESULTS CONSOLE GC/CHLAMYDIA: Gonorrhea: NEGATIVE

## 2018-02-13 NOTE — Progress Notes (Signed)
   PRENATAL VISIT NOTE  Subjective:  Melanie Harmon is a 27 y.o. [id number] at [redacted]w[redacted]d being seen today for ongoing prenatal care.  She is currently monitored for the following issues for this high-risk pregnancy and has Headaches due to old head injury; History of cervical incompetence in pregnancy, currently pregnant, unspecified trimester; Supervision of other high risk pregnancy, antepartum; Morbid obesity (HCC); and Cervical cerclage suture present, antepartum on their problem list.  Patient reports no complaints.  Contractions: Irritability. Vag. Bleeding: None.  Movement: Present. Denies leaking of fluid.   The following portions of the patient's history were reviewed and updated as appropriate: allergies, current medications, past family history, past medical history, past social history, past surgical history and problem list. Problem list updated.  Objective:   Vitals:   02/13/18 1054 02/13/18 1059  BP: (!) 108/49 117/62  Pulse: 97 (!) 103  Weight: (!) 337 lb 12.8 oz (153.2 kg)     Fetal Status:     Movement: Present     General:  Alert, oriented and cooperative. Patient is in no acute distress.  Skin: Skin is warm and dry. No rash noted.   Cardiovascular: Normal heart rate noted  Respiratory: Normal respiratory effort, no problems with respiration noted  Abdomen: Soft, gravid, appropriate for gestational age.  Pain/Pressure: Present     Pelvic: Cervical exam performed        Extremities: Normal range of motion.  Edema: Trace  Mental Status: Normal mood and affect. Normal behavior. Normal judgment and thought content.   Assessment and Plan:  Pregnancy: [name] at [redacted]w[redacted]d  1. Supervision of high risk pregnancy, antepartum FHT and FH normal - Culture, beta strep (group b only) - GC/Chlamydia probe amp ([place])not at [facility]  2. Cervical cerclage suture present, antepartum Cerclage removed in its entirety.  3. Morbid obesity (HCC)  4. Cervical incompetence   Preterm labor  symptoms and general obstetric precautions including but not limited to vaginal bleeding, contractions, leaking of fluid and fetal movement were reviewed in detail with the patient. Please refer to After Visit Summary for other counseling recommendations.  No follow-ups on file.  Future Appointments  Date Time Provider Department Center  02/19/2018  1:00 PM [facility] [place] 3 [facility]-US  02/20/2018 11:30 AM Aviva Signs, CNM [facility] None    Levie Heritage, DO

## 2018-02-17 LAB — GC/CHLAMYDIA PROBE AMP (~~LOC~~) NOT AT ARMC
CHLAMYDIA, DNA PROBE: NEGATIVE
NEISSERIA GONORRHEA: NEGATIVE

## 2018-02-18 LAB — CULTURE, BETA STREP (GROUP B ONLY)

## 2018-02-19 ENCOUNTER — Ambulatory Visit (HOSPITAL_COMMUNITY)
Admission: RE | Admit: 2018-02-19 | Discharge: 2018-02-19 | Disposition: A | Discharge status: Home / Self Care | Payer: Medicaid Other | Source: Ambulatory Visit | Attending: Advanced Practice Midwife | Admitting: Advanced Practice Midwife

## 2018-02-19 ENCOUNTER — Other Ambulatory Visit (HOSPITAL_COMMUNITY): Payer: Self-pay | Admitting: Maternal and Fetal Medicine

## 2018-02-19 ENCOUNTER — Encounter (HOSPITAL_COMMUNITY): Payer: Self-pay

## 2018-02-19 DIAGNOSIS — O09293 Supervision of pregnancy with other poor reproductive or obstetric history, third trimester: Secondary | ICD-10-CM | POA: Insufficient documentation

## 2018-02-19 DIAGNOSIS — Z3A37 37 weeks gestation of pregnancy: Secondary | ICD-10-CM | POA: Diagnosis not present

## 2018-02-19 DIAGNOSIS — Z362 Encounter for other antenatal screening follow-up: Secondary | ICD-10-CM

## 2018-02-19 DIAGNOSIS — Z8759 Personal history of other complications of pregnancy, childbirth and the puerperium: Secondary | ICD-10-CM

## 2018-02-19 DIAGNOSIS — O3660X0 Maternal care for excessive fetal growth, unspecified trimester, not applicable or unspecified: Secondary | ICD-10-CM

## 2018-02-19 DIAGNOSIS — O3433 Maternal care for cervical incompetence, third trimester: Secondary | ICD-10-CM

## 2018-02-19 DIAGNOSIS — O3430 Maternal care for cervical incompetence, unspecified trimester: Secondary | ICD-10-CM

## 2018-02-19 DIAGNOSIS — O99213 Obesity complicating pregnancy, third trimester: Secondary | ICD-10-CM | POA: Insufficient documentation

## 2018-02-19 DIAGNOSIS — O3663X0 Maternal care for excessive fetal growth, third trimester, not applicable or unspecified: Secondary | ICD-10-CM | POA: Insufficient documentation

## 2018-02-19 DIAGNOSIS — O09899 Supervision of other high risk pregnancies, unspecified trimester: Secondary | ICD-10-CM

## 2018-02-19 DIAGNOSIS — O26843 Uterine size-date discrepancy, third trimester: Secondary | ICD-10-CM

## 2018-02-20 ENCOUNTER — Encounter: Payer: Self-pay | Admitting: Advanced Practice Midwife

## 2018-02-20 ENCOUNTER — Ambulatory Visit (INDEPENDENT_AMBULATORY_CARE_PROVIDER_SITE_OTHER): Payer: Medicaid Other | Admitting: Advanced Practice Midwife

## 2018-02-20 VITALS — BP 112/79 | HR 105 | Wt 343.0 lb

## 2018-02-20 DIAGNOSIS — O3430 Maternal care for cervical incompetence, unspecified trimester: Secondary | ICD-10-CM

## 2018-02-20 DIAGNOSIS — O3433 Maternal care for cervical incompetence, third trimester: Secondary | ICD-10-CM

## 2018-02-20 DIAGNOSIS — O0993 Supervision of high risk pregnancy, unspecified, third trimester: Secondary | ICD-10-CM

## 2018-02-20 DIAGNOSIS — O0990 Supervision of high risk pregnancy, unspecified, unspecified trimester: Secondary | ICD-10-CM

## 2018-02-20 NOTE — Patient Instructions (Signed)
Terceiro trimestre Scientist, research (medical) (Third Trimester of Pregnancy) O terceiro trimestre vai da semana 29 at a semana 42, do 7 ao 9 ms. O terceiro trimestre  uma poca em que o feto se desenvolve rapidamente. No final do nono ms, o feto tem por volta de 20 polegadas de comprimento e pesa entre 6-10 libras. ALTERAES CORPORAIS Seu corpo passa por muitas mudanas durante a gestao. As mudanas variam de mulher para [employer].  Seu peso continuar a Administrator, Civil Service. Voc pode esperar ganhar de 25-35 libras (11-16 kg) at o final [employer].  Voc pode comear a ter estrias nos quadris, abdome e seios.  Voc pode urinar com mais frequncia, pois o feto est se movendo para baixo em sua plvis e pressionando sua bexiga.  Voc pode desenvolver ou continuar a ter Education administrator resultado de Hazel Sams.  Voc pode ficar com o intestino preso, pois certos hormnios esto fazendo com que os msculos que empurram as fezes em seu intestino fiquem mais lentos.  Voc pode desenvolver hemorroidas ou veias inchadas e salientes (veias varicosas).  Voc pode ter dor plvica devido ao ganho de peso e aos hormnios da gravidez que relaxam suas articulaes entre os ossos de sua plvis. Dores nas costas podem ser causadas pela [employer] que do suporte  [place].  Voc pode ter alteraes em seus cabelos. Essas alteraes incluem afinamento, crescimento rpido e alteraes na textura de seu cabelo. Algumas mulheres tambm sofrem queda de cabelos durante ou aps a gravidez, ou cabelos com aspecto seco e fino. Seus cabelos provavelmente voltaro ao normal depois do nascimento do beb.  Seus seios continuaro a crescer e a ficar sensveis. Um fluido amarelo, chamado de [place], pode vazar de seus seios.  Seu umbigo pode saltar para fora.  Voc pode sentir falta de ar devido a expanso de seu tero.  Voc pode sentir o feto "caindo", ou se movendo para baixo em seu abdome.  Voc pode ter um corrimento  mucoso com sangue. Isso geralmente ocorre de alguns dias a uma semana antes do parto.  Seu colo do tero fica fino e macio (afinado) com a Engineer, drilling do parto. O QUE ESPERAR DE SEUS EXAMES PR-NATAL Voc realizar exames pr-natal a cada 2 semanas at a 36 semana. Depois, voc realizar exames pr-natal semanais. Durante uma consulta pr-natal de rotina:  Voc ser pesada para garantir que voc e o feto estejam crescendo normalmente.  Sua presso arterial ser medida.  Seu abdome ser medido para acompanhar o crescimento de seu beb.  Os batimentos cardacos do feto sero auscultados.  Todos os [employer] da consulta anterior sero discutidos.  Voc poder realizar um exame cervical quando a data do parto se aproximar para verificar sua dilatao. Por volta de 36 semanas, o mdico verificar seu colo do tero. Ao mesmo tempo, seu mdico tambm ir realizar um exame das secrees do tecido vaginal. Esse exame serve para determinar se um tipo de bactria, o estreptococo do grupo B, est presente. Seu mdico ir explicar isso mais adiante. Seu mdico pode lhe perguntar:  Qual  seu plano para o parto.  Como voc est se sentindo.  Se voc est sentindo o beb se mexer.  Se voc teve algum sintoma anormal, como vazamento de lquido, sangramento, dores de cabea intensas ou clicas abdominais.  Se voc est usando produtos de tabaco, incluindo cigarros, tabaco de Theatre manager ou cigarros eletrnicos.  Se voc tem alguma dvida. Outros exames ou avaliaes que podero ser realizados durante seu  terceiro trimestre incluem:  Exames de sangue que verificam se h nveis baixos de ferro (anemia).  Exame final para verificar a sade, nvel de atividade e crescimento do feto. O teste  realizado se voc tiver certas [employer] ou se houver problemas durante a Manufacturing systems engineer.  Exame de HIV (sndrome da imunodeficincia adquirida). Se voc tiver risco alto, poder ser testada  para HIV durante o terceiro trimestre Scientist, research (medical). FALSO TRABALHO DE PARTO Voc pode sentir contraes pequenas e irregulares que depois passam. Elas so chamadas de contraes [address], ou falso trabalho de [place]. As contraes podem durar por horas, dias ou mesmo semanas antes do trabalho de Research scientist (medical). Se as contraes vierem em intervalos regulares, se intensificarem, ou se tornarem dolorosas,  melhor que sejam observadas pelo seu mdico. SINAIS DO TRABALHO DE PARTO  Clicas do tipo menstrual.  Contraes com intervalos de 5 minutos ou menos.  Contraes que comeam na parte superior do tero e se espalha para a parte inferior do abdome e costas.  Uma sensao de presso plvica aumentada ou dor nas costas.  Um corrimento mucoso aquoso ou com sangue que vem da vagina. Se voc tiver qualquer um desses sintomas antes da 37 semana de gestao, ligue para seu mdico imediatamente. Voc precisa ir para o hospital para ser examinada imediatamente. INSTRUES PARA TRATAMENTO DOMICILIAR  Evite fumar, usar plantas medicinais, lcool e medicamentos no prescritos. Esses produtos qumicos afetam a formao e crescimento do beb.  No use nenhum produto de tabaco, incluindo cigarros, tabaco de mascar e cigarros eletrnicos. Caso precise de ajuda para parar de fumar, fale com seu mdico. Voc pode receber aconselhamento e outros recursos para lhe ajudar a parar.  Siga as instrues de seu mdico com relao ao uso de medicamentos. H medicamentos que so seguros ou perigosos de serem tomados durante a gestao.  Faa exerccios apenas conforme orientado pelo seu mdico. Sentir clicas uterinas  um sinal importante para parar de Chief Strategy Officer.  Continue a fazer refeies regulares e saudveis.  Use um bom suti de sustentao para a sensibilidade nos seios.  No use banheiras de gua quente, saunas secas [address].  Sempre use o cinto de Music therapist.  Evite comer  carnes cruas, queijo no cozido, mantenha distncia de caixas de [place] de gatos e de terrenos onde houver gatos. Eles carregam germes que podem causar malformaes ao beb.  Tome suas vitaminas pr-natal.  Tome de 1.500-2.000 mg de clcio todos os dias, comeando na 20 semana de [place] at [employer] do beb.  Tente tomar um laxante emoliente (se seu mdico aprovar) caso fique com intestino preso. Coma mais alimentos ricos em [place], como vegetais frescos ou frutas e gros integrais. Beba bastante lquido para manter sua urina clara ou na cor amarela plida.  Tome banhos mornos de assento para Child psychotherapist dor ou desconforto causado pelas hemorroidas. Use um creme para hemorroidas caso seu mdico aprove.  Se voc desenvolver veias varicosas, use meias elsticas. Eleve seus ps por 15 minutos, de 3-4 vezes por dia. Modere a quantidade de sal em sua dieta.  Evite levantar peso, use sapatos sem salto e mantenha uma boa postura.  Repouse bastante com suas pernas elevadas se tiver cibras nas pernas ou dor lombar.  Visite seu dentista se ainda no o tiver [employer]. Use uma escova de dentes macia para escovar os dentes e use o fio dental com cuidado.  As relaes sexuais podem continuar a menos que seu mdico a oriente  de [employer].  No viaje longas distncias a menos que seja absolutamente necessrio e apenas com a aprovao de seu mdico.  Assista aulas de pr-natal para compreender, praticar e fazer perguntas sobre o trabalho de parto e o parto.  Faa uma viagem experimental para o hospital.  Faa sua mala para o hospital.  Prepare o quarto do beb.  Continue a comparecer a todas as suas consultas pr-natal conforme orientado por seu mdico.  PROCURE UM MDICO SE:  No tiver certeza se est em trabalho de parto ou se sua bolsa romper.  Tiver vertigem.  Tiver cibras plvicas suaves, presso plvica ou dor persistente em sua regio  abdominal.  Tiver nusea, vmitos ou diarreia persistentes.  Tiver corrimento vaginal com cheiro ruim.  Sentir dor Engineer, maintenance (IT).  PROCURE UM MDICO IMEDIATAMENTE SE:  Tiver febre.  Notar fluido vazando de sua vagina.  Perceber manchas ou sangramento vaginal.  Tiver dor ou cibra intensa no abdome.  Tiver perda ou ganho sbito de [place].  Tiver falta de ar com dor no peito.  Perceber inchao repentino ou extremo em seu rosto, mos, tornozelos, ps ou pernas.  No sentir seu beb se mexer por mais de uma hora.  Tiver dores de cabea intensas que no passam com medicao.  Sua viso ficar alterada.  Estas informaes no se destinam a substituir as recomendaes de seu mdico. No deixe de discutir quaisquer dvidas com seu mdico. Document Released: 10/13/2015 Document Revised: 10/13/2015 Document Reviewed: 11/17/2012 Elsevier Interactive Patient Education  2017 [employer].

## 2018-02-20 NOTE — Progress Notes (Signed)
   PRENATAL VISIT NOTE  Subjective:  Melanie Harmon is a 27 y.o. [id number] at [redacted]w[redacted]d being seen today for ongoing prenatal care.  She is currently monitored for the following issues for this high-risk pregnancy and has Headaches due to old head injury; History of cervical incompetence in pregnancy, currently pregnant, unspecified trimester; Supervision of other high risk pregnancy, antepartum; Morbid obesity (HCC); and Cervical cerclage suture present, antepartum on their problem list.  Patient reports occasional contractions.  Contractions: Irritability. Vag. Bleeding: None.  Movement: Present. Denies leaking of fluid.   The following portions of the patient's history were reviewed and updated as appropriate: allergies, current medications, past family history, past medical history, past social history, past surgical history and problem list. Problem list updated.  Objective:   Vitals:   02/20/18 1134  BP: 112/79  Pulse: (!) 105  Weight: (!) 343 lb (155.6 kg)    Fetal Status: Fetal Heart Rate (bpm): 140   Movement: Present     General:  Alert, oriented and cooperative. Patient is in no acute distress.  Skin: Skin is warm and dry. No rash noted.   Cardiovascular: Normal heart rate noted  Respiratory: Normal respiratory effort, no problems with respiration noted  Abdomen: Soft, gravid, appropriate for gestational age.  Pain/Pressure: Present     Pelvic: Cervical exam performed       1+/80/0-1/vtx.   Extremities: Normal range of motion.  Edema: Trace  Mental Status: Normal mood and affect. Normal behavior. Normal judgment and thought content.   Assessment and Plan:  Pregnancy: [name] at [redacted]w[redacted]d  1. Cervical cerclage suture present, antepartum    Removed last week    Discussed labor precautions  Term labor symptoms and general obstetric precautions including but not limited to vaginal bleeding, contractions, leaking of fluid and fetal movement were reviewed in detail with the  patient. Please refer to After Visit Summary for other counseling recommendations.  RTO 1 week NST done today, reactive, category I They recommended twice weekly NSTs due to obesity, start today  Future Appointments  Date Time Provider Department Center  02/25/2018  2:45 PM Willodean Rosenthal, MD [facility] None  02/27/2018 10:30 AM [facility] NURSE [facility] None  03/03/2018 10:45 AM Aviva Signs, CNM [facility] None  03/06/2018 10:30 AM [facility] NURSE [facility] None    Wynelle Bourgeois, CNM

## 2018-02-20 NOTE — Progress Notes (Deleted)
   PRENATAL VISIT NOTE  Subjective:  Melanie Harmon is a 27 y.o. [id number] at [redacted]w[redacted]d being seen today for ongoing prenatal care.  She is currently monitored for the following issues for this {Blank single:19197::"high-risk","low-risk"} pregnancy and has Headaches due to old head injury; History of cervical incompetence in pregnancy, currently pregnant, unspecified trimester; Supervision of other high risk pregnancy, antepartum; Morbid obesity (HCC); and Cervical cerclage suture present, antepartum on their problem list.  Patient reports {sx:[id number]}.  Contractions: Irritability. Vag. Bleeding: None.  Movement: Present. Denies leaking of fluid.   The following portions of the patient's history were reviewed and updated as appropriate: allergies, current medications, past family history, past medical history, past social history, past surgical history and problem list. Problem list updated.  Objective:   Vitals:   02/20/18 1134  BP: 112/79  Pulse: (!) 105  Weight: (!) 343 lb (155.6 kg)    Fetal Status: Fetal Heart Rate (bpm): 140   Movement: Present     General:  Alert, oriented and cooperative. Patient is in no acute distress.  Skin: Skin is warm and dry. No rash noted.   Cardiovascular: Normal heart rate noted  Respiratory: Normal respiratory effort, no problems with respiration noted  Abdomen: Soft, gravid, appropriate for gestational age.  Pain/Pressure: Present     Pelvic: {Blank single:19197::"Cervical exam performed","Cervical exam deferred"}        Extremities: Normal range of motion.  Edema: Trace  Mental Status: Normal mood and affect. Normal behavior. Normal judgment and thought content.   Assessment and Plan:  Pregnancy: [name] at [redacted]w[redacted]d  1. Cervical cerclage suture present, antepartum ***  {Blank single:19197::"Term","Preterm"} labor symptoms and general obstetric precautions including but not limited to vaginal bleeding, contractions, leaking of fluid and fetal movement were  reviewed in detail with the patient. Please refer to After Visit Summary for other counseling recommendations.  No follow-ups on file.  Future Appointments  Date Time Provider Department Center  02/25/2018  2:45 PM Willodean Rosenthal, MD [facility] None  02/27/2018 10:30 AM [facility] NURSE [facility] None  03/03/2018 10:45 AM Aviva Signs, CNM [facility] None  03/06/2018 10:30 AM [facility] NURSE [facility] None    Wynelle Bourgeois, CNM

## 2018-02-25 ENCOUNTER — Telehealth (HOSPITAL_COMMUNITY): Payer: Self-pay | Admitting: *Deleted

## 2018-02-25 ENCOUNTER — Ambulatory Visit (INDEPENDENT_AMBULATORY_CARE_PROVIDER_SITE_OTHER): Payer: Medicaid Other | Admitting: Obstetrics & Gynecology

## 2018-02-25 VITALS — BP 118/83 | HR 117 | Wt 342.0 lb

## 2018-02-25 DIAGNOSIS — O3430 Maternal care for cervical incompetence, unspecified trimester: Secondary | ICD-10-CM

## 2018-02-25 DIAGNOSIS — O09299 Supervision of pregnancy with other poor reproductive or obstetric history, unspecified trimester: Secondary | ICD-10-CM

## 2018-02-25 DIAGNOSIS — O09893 Supervision of other high risk pregnancies, third trimester: Secondary | ICD-10-CM

## 2018-02-25 DIAGNOSIS — G44309 Post-traumatic headache, unspecified, not intractable: Secondary | ICD-10-CM

## 2018-02-25 DIAGNOSIS — O09293 Supervision of pregnancy with other poor reproductive or obstetric history, third trimester: Secondary | ICD-10-CM

## 2018-02-25 DIAGNOSIS — O99213 Obesity complicating pregnancy, third trimester: Secondary | ICD-10-CM

## 2018-02-25 DIAGNOSIS — O09899 Supervision of other high risk pregnancies, unspecified trimester: Secondary | ICD-10-CM

## 2018-02-25 DIAGNOSIS — S0990XS Unspecified injury of head, sequela: Secondary | ICD-10-CM

## 2018-02-25 NOTE — Progress Notes (Signed)
   PRENATAL VISIT NOTE  Subjective:  Melanie Harmon is a 27 y.o. Z6X0960 at [redacted]w[redacted]d being seen today for ongoing prenatal care.  She is currently monitored for the following issues for this high-risk pregnancy and has Headaches due to old head injury; History of cervical incompetence in pregnancy, currently pregnant, unspecified trimester; Supervision of other high risk pregnancy, antepartum; Morbid obesity (HCC); and Cervical cerclage suture present, antepartum on their problem list.  Patient reports no complaints.   . Vag. Bleeding: None.  Movement: Present. Denies leaking of fluid.   The following portions of the patient's history were reviewed and updated as appropriate: allergies, current medications, past family history, past medical history, past social history, past surgical history and problem list. Problem list updated.  Objective:   Vitals:   02/25/18 1433  BP: 118/83  Pulse: (!) 117  Weight: (!) 342 lb (155.1 kg)    Fetal Status:     Movement: Present     General:  Alert, oriented and cooperative. Patient is in no acute distress.  Skin: Skin is warm and dry. No rash noted.   Cardiovascular: Normal heart rate noted  Respiratory: Normal respiratory effort, no problems with respiration noted  Abdomen: Soft, gravid, appropriate for gestational age.  Pain/Pressure: Present     Pelvic: Cervical exam performed        Extremities: Normal range of motion.  Edema: Trace  Mental Status: Normal mood and affect. Normal behavior. Normal judgment and thought content.   Assessment and Plan:  Pregnancy: [name] at [redacted]w[redacted]d  1. Supervision of other high risk pregnancy, antepartum NST reviewed and reactive.  2. Morbid obesity (HCC)  02/19/2018 Est. FW:    4327  gm      9 lb 9 oz   > 90  %  3. History of cervical incompetence in pregnancy, currently pregnant, unspecified trimester  4. Headaches due to old head injury  5. Cervical cerclage suture present, antepartum Removed at 36  weeks  6. Fetal macrosomia.  Reviewed with pt mode of delivery. She does not want a c-section. I reviewed with her the potential risks of a vaginal delivery should the fetus be >4500 grams including the risk of shoulder dystocia. I have also explained to her that the [place] can be off by 10% up or down.  Pt wishes to proceed with IOL at 39 weeks.      Term labor symptoms and general obstetric precautions including but not limited to vaginal bleeding, contractions, leaking of fluid and fetal movement were reviewed in detail with the patient. Please refer to After Visit Summary for other counseling recommendations.  Return in about 1 week (around 03/04/2018).  Future Appointments  Date Time Provider Department Center  02/27/2018 10:30 AM [facility] NURSE [facility] None  03/03/2018 10:45 AM Aviva Signs, CNM [facility] None  03/06/2018 10:30 AM [facility] NURSE [facility] None    Willodean Rosenthal, MD

## 2018-02-26 ENCOUNTER — Inpatient Hospital Stay (HOSPITAL_COMMUNITY): Payer: Medicaid Other | Admitting: Anesthesiology

## 2018-02-26 ENCOUNTER — Encounter: Payer: Self-pay | Admitting: Obstetrics & Gynecology

## 2018-02-26 ENCOUNTER — Inpatient Hospital Stay (HOSPITAL_COMMUNITY)
Admission: AD | Admit: 2018-02-26 | Discharge: 2018-03-01 | DRG: 805 | Disposition: A | Discharge status: Home / Self Care | Payer: Medicaid Other | Attending: Obstetrics and Gynecology | Admitting: Obstetrics and Gynecology

## 2018-02-26 ENCOUNTER — Other Ambulatory Visit: Payer: Self-pay

## 2018-02-26 ENCOUNTER — Encounter (HOSPITAL_COMMUNITY): Payer: Self-pay

## 2018-02-26 DIAGNOSIS — Z3483 Encounter for supervision of other normal pregnancy, third trimester: Secondary | ICD-10-CM | POA: Diagnosis present

## 2018-02-26 DIAGNOSIS — O9962 Diseases of the digestive system complicating childbirth: Secondary | ICD-10-CM | POA: Diagnosis present

## 2018-02-26 DIAGNOSIS — Z87891 Personal history of nicotine dependence: Secondary | ICD-10-CM

## 2018-02-26 DIAGNOSIS — Z3A38 38 weeks gestation of pregnancy: Secondary | ICD-10-CM | POA: Diagnosis not present

## 2018-02-26 DIAGNOSIS — O3663X0 Maternal care for excessive fetal growth, third trimester, not applicable or unspecified: Secondary | ICD-10-CM | POA: Diagnosis present

## 2018-02-26 DIAGNOSIS — O411230 Chorioamnionitis, third trimester, not applicable or unspecified: Secondary | ICD-10-CM | POA: Diagnosis present

## 2018-02-26 DIAGNOSIS — K219 Gastro-esophageal reflux disease without esophagitis: Secondary | ICD-10-CM | POA: Diagnosis present

## 2018-02-26 DIAGNOSIS — O99214 Obesity complicating childbirth: Secondary | ICD-10-CM | POA: Diagnosis present

## 2018-02-26 DIAGNOSIS — O134 Gestational [pregnancy-induced] hypertension without significant proteinuria, complicating childbirth: Principal | ICD-10-CM | POA: Diagnosis present

## 2018-02-26 DIAGNOSIS — O09899 Supervision of other high risk pregnancies, unspecified trimester: Secondary | ICD-10-CM

## 2018-02-26 LAB — COMPREHENSIVE METABOLIC PANEL
ALT: 12 U/L — ABNORMAL LOW (ref 14–54)
AST: 17 U/L (ref 15–41)
Albumin: 2.9 g/dL — ABNORMAL LOW (ref 3.5–5.0)
Alkaline Phosphatase: 111 U/L (ref 38–126)
Anion gap: 12 (ref 5–15)
BUN: 9 mg/dL (ref 6–20)
CALCIUM: 9 mg/dL (ref 8.9–10.3)
CO2: 19 mmol/L — AB (ref 22–32)
Chloride: 105 mmol/L (ref 101–111)
Creatinine, Ser: 0.44 mg/dL (ref 0.44–1.00)
Glucose, Bld: 97 mg/dL (ref 65–99)
Potassium: 4 mmol/L (ref 3.5–5.1)
Sodium: 136 mmol/L (ref 135–145)
TOTAL PROTEIN: 6.5 g/dL (ref 6.5–8.1)
Total Bilirubin: 0.3 mg/dL (ref 0.3–1.2)

## 2018-02-26 LAB — HEPATITIS B SURFACE ANTIGEN: HEP B S AG: NEGATIVE

## 2018-02-26 LAB — URINALYSIS, ROUTINE W REFLEX MICROSCOPIC
BILIRUBIN URINE: NEGATIVE
Glucose, UA: NEGATIVE mg/dL
Ketones, ur: NEGATIVE mg/dL
NITRITE: NEGATIVE
Protein, ur: NEGATIVE mg/dL
Specific Gravity, Urine: 1.009 (ref 1.005–1.030)
pH: 7 (ref 5.0–8.0)

## 2018-02-26 LAB — CBC
HCT: 34.6 % — ABNORMAL LOW (ref 36.0–46.0)
Hemoglobin: 11.4 g/dL — ABNORMAL LOW (ref 12.0–15.0)
MCH: 28.6 pg (ref 26.0–34.0)
MCHC: 32.9 g/dL (ref 30.0–36.0)
MCV: 86.9 fL (ref 78.0–100.0)
PLATELETS: 237 10*3/uL (ref 150–400)
RBC: 3.98 MIL/uL (ref 3.87–5.11)
RDW: 14.5 % (ref 11.5–15.5)
WBC: 7.4 10*3/uL (ref 4.0–10.5)

## 2018-02-26 LAB — PROTEIN / CREATININE RATIO, URINE
Creatinine, Urine: 50 mg/dL
PROTEIN CREATININE RATIO: 0.18 mg/mg{creat} — AB (ref 0.00–0.15)
Total Protein, Urine: 9 mg/dL

## 2018-02-26 LAB — TYPE AND SCREEN
ABO/RH(D): O POS
Antibody Screen: NEGATIVE

## 2018-02-26 LAB — RPR: RPR Ser Ql: NONREACTIVE

## 2018-02-26 LAB — POCT FERN TEST: POCT Fern Test: POSITIVE

## 2018-02-26 MED ORDER — EPHEDRINE 5 MG/ML INJ
10.0000 mg | INTRAVENOUS | Status: DC | PRN
  Filled 2018-02-26: qty 2

## 2018-02-26 MED ORDER — FLEET ENEMA 7-19 GM/118ML RE ENEM: 1.0000 | ENEMA | RECTAL | Status: DC | PRN

## 2018-02-26 MED ORDER — FENTANYL CITRATE (PF) 100 MCG/2ML IJ SOLN
100.0000 ug | INTRAMUSCULAR | Status: DC | PRN
  Administered 2018-02-26 (×2): 100 ug via INTRAVENOUS
  Filled 2018-02-26 (×2): qty 2

## 2018-02-26 MED ORDER — ACETAMINOPHEN 325 MG PO TABS
650.0000 mg | ORAL_TABLET | ORAL | Status: DC | PRN
  Administered 2018-02-26: 650 mg via ORAL
  Filled 2018-02-26: qty 2

## 2018-02-26 MED ORDER — LACTATED RINGERS IV SOLN
INTRAVENOUS | Status: DC
  Administered 2018-02-26 (×4): via INTRAVENOUS

## 2018-02-26 MED ORDER — PHENYLEPHRINE 40 MCG/ML (10ML) SYRINGE FOR IV PUSH (FOR BLOOD PRESSURE SUPPORT)
80.0000 ug | PREFILLED_SYRINGE | INTRAVENOUS | Status: DC | PRN
  Filled 2018-02-26: qty 5

## 2018-02-26 MED ORDER — OXYCODONE-ACETAMINOPHEN 5-325 MG PO TABS: 1.0000 | ORAL_TABLET | ORAL | Status: DC | PRN

## 2018-02-26 MED ORDER — OXYTOCIN BOLUS FROM INFUSION
500.0000 mL | Freq: Once | INTRAVENOUS | Status: AC
  Administered 2018-02-27: 500 mL via INTRAVENOUS

## 2018-02-26 MED ORDER — GENTAMICIN SULFATE 40 MG/ML IJ SOLN
250.0000 mg | Freq: Three times a day (TID) | INTRAVENOUS | Status: DC
  Administered 2018-02-26: 250 mg via INTRAVENOUS
  Filled 2018-02-26 (×3): qty 6.25

## 2018-02-26 MED ORDER — SOD CITRATE-CITRIC ACID 500-334 MG/5ML PO SOLN: 30.0000 mL | ORAL | Status: DC | PRN

## 2018-02-26 MED ORDER — FENTANYL 2.5 MCG/ML BUPIVACAINE 1/10 % EPIDURAL INFUSION (WH - ANES)
INTRAMUSCULAR | Status: AC
  Filled 2018-02-26: qty 100

## 2018-02-26 MED ORDER — OXYTOCIN 40 UNITS IN LACTATED RINGERS INFUSION - SIMPLE MED
1.0000 m[IU]/min | INTRAVENOUS | Status: DC
  Administered 2018-02-26: 2 m[IU]/min via INTRAVENOUS
  Filled 2018-02-26: qty 1000

## 2018-02-26 MED ORDER — LACTATED RINGERS IV SOLN
500.0000 mL | INTRAVENOUS | Status: DC | PRN
Start: 2018-02-26 — End: 2018-02-27
  Administered 2018-02-26: 500 mL via INTRAVENOUS
  Administered 2018-02-26: 1000 mL via INTRAVENOUS

## 2018-02-26 MED ORDER — PHENYLEPHRINE 40 MCG/ML (10ML) SYRINGE FOR IV PUSH (FOR BLOOD PRESSURE SUPPORT)
PREFILLED_SYRINGE | INTRAVENOUS | Status: AC
  Filled 2018-02-26: qty 20

## 2018-02-26 MED ORDER — ONDANSETRON HCL 4 MG/2ML IJ SOLN
4.0000 mg | Freq: Four times a day (QID) | INTRAMUSCULAR | Status: DC | PRN
  Administered 2018-02-26: 4 mg via INTRAVENOUS
  Filled 2018-02-26: qty 2

## 2018-02-26 MED ORDER — LACTATED RINGERS IV SOLN
500.0000 mL | Freq: Once | INTRAVENOUS | Status: AC
  Administered 2018-02-26: 500 mL via INTRAVENOUS

## 2018-02-26 MED ORDER — SODIUM CHLORIDE 0.9 % IV SOLN
2.0000 g | Freq: Four times a day (QID) | INTRAVENOUS | Status: DC
  Administered 2018-02-26: 2 g via INTRAVENOUS
  Filled 2018-02-26: qty 2
  Filled 2018-02-26 (×2): qty 2000

## 2018-02-26 MED ORDER — OXYTOCIN 40 UNITS IN LACTATED RINGERS INFUSION - SIMPLE MED: 2.5000 [IU]/h | INTRAVENOUS | Status: DC

## 2018-02-26 MED ORDER — DIPHENHYDRAMINE HCL 50 MG/ML IJ SOLN: 12.5000 mg | INTRAMUSCULAR | Status: DC | PRN

## 2018-02-26 MED ORDER — LIDOCAINE HCL (PF) 1 % IJ SOLN
INTRAMUSCULAR | Status: DC | PRN
  Administered 2018-02-26: 8 mL via EPIDURAL

## 2018-02-26 MED ORDER — LIDOCAINE HCL (PF) 1 % IJ SOLN
30.0000 mL | INTRAMUSCULAR | Status: AC | PRN
  Administered 2018-02-27: 30 mL via SUBCUTANEOUS
  Filled 2018-02-26: qty 30

## 2018-02-26 MED ORDER — FENTANYL 2.5 MCG/ML BUPIVACAINE 1/10 % EPIDURAL INFUSION (WH - ANES)
14.0000 mL/h | INTRAMUSCULAR | Status: DC | PRN
  Administered 2018-02-26 (×2): 14 mL/h via EPIDURAL
  Filled 2018-02-26: qty 100

## 2018-02-26 MED ORDER — TERBUTALINE SULFATE 1 MG/ML IJ SOLN: 0.2500 mg | Freq: Once | INTRAMUSCULAR | Status: DC | PRN

## 2018-02-26 MED ORDER — OXYCODONE-ACETAMINOPHEN 5-325 MG PO TABS: 2.0000 | ORAL_TABLET | ORAL | Status: DC | PRN

## 2018-02-26 NOTE — Progress Notes (Signed)
Kadejah Sandiford is a 27 y.o. W2N5621 at [redacted]w[redacted]d by LMP admitted for rupture of membranes at 0300AM  Subjective: Patient resting in bed comfortably, on epidural  Objective: BP 134/74   Pulse (!) 120   Temp 98 F (36.7 C) (Oral)   Resp 18   Ht  (1.727 m)   Wt (!) 152 kg (335 lb 1.9 oz)   LMP 06/03/2017   BMI 50.95 kg/m  No intake/output data recorded. No intake/output data recorded.  FHT:  FHR: 135 bpm, variability: moderate,  accelerations:  Present,  decelerations:  Absent UC:   regular, every 1-2 minutes SVE:   Dilation: 7 Effacement (%): 90 Station: 0, Plus 1 Exam by:: Dr. Cristie Hem  Labs: Lab Results  Component Value Date   WBC 7.4 02/26/2018   HGB 11.4 (L) 02/26/2018   HCT 34.6 (L) 02/26/2018   MCV 86.9 02/26/2018   PLT 237 02/26/2018    Assessment / Plan: Augmentation of labor, s/p SROM, progressing well  Labor: SROM , IUPC replaced. Progressing on Pitocin, will continue to increase as appropriate Fetal Wellbeing:  Category I Pain Control:  Epidural I/D:  n/a Anticipated MOD:  NSVD  Felisa Bonier, MD, PGY-1 Family Medicine - [facility] [place] 02/26/2018, 6:29 PM

## 2018-02-26 NOTE — Progress Notes (Signed)
Melanie Harmon is a 27 y.o. [id number] at [redacted]w[redacted]d by LMP admitted for rupture of membranes at 0300AM on 5/30.  Subjective: Patient resting in bed comfortably, on epidural. No questions. Concerned about the progress.  Objective: BP 125/62   Pulse (!) 129   Temp 98.4 F (36.9 C) (Oral)   Resp 16   Ht  (1.727 m)   Wt (!) 335 lb 1.9 oz (152 kg)   LMP 06/03/2017   BMI 50.95 kg/m  No intake/output data recorded. No intake/output data recorded.  FHT:  FHR: 140 bpm, variability: moderate,  accelerations:  Present,  decelerations:  Absent UC: irregular, every 2-5 minutes SVE:   Dilation: 7 Effacement (%): 90 Station: 0, Plus 1 Exam by:: Melanie, CNM  Pitocin /min  Labs: Lab Results  Component Value Date   WBC 7.4 02/26/2018   HGB 11.4 (L) 02/26/2018   HCT 34.6 (L) 02/26/2018   MCV 86.9 02/26/2018   PLT 237 02/26/2018    Assessment / Plan: Augmentation of labor, s/p SROM Progressing normally  Labor: s/p SROM. Pitocin cut off earlier due to tachysystole. Restarted at 62mu/min. IUPC in place. Not having adequate contractions. Will increase Pitocin as appropriate. Fetal Wellbeing:  Category I Pain Control:  Epidural I/D:  n/a Anticipated MOD:  NSVD  Caryl Ada, DO OB Fellow Center for [facility], [facility]

## 2018-02-26 NOTE — Progress Notes (Signed)
Melanie Harmon is a 27 y.o. [id number] at [redacted]w[redacted]d admitted for SROM and early labor  Subjective: Patient resting in chair, requesting IV Fentanyl. States she is aware of contractions, and is "just really tired, hoping for a nap". Planning to manage labor discomfort with Nitrous Oxide due to previously negative experience with epidural.  Objective: BP 134/70   Pulse (!) 105   Temp 98 F (36.7 C) (Oral)   Resp 18   Ht  (1.727 m)   Wt (!) 335 lb 1.9 oz (152 kg)   LMP 06/03/2017   BMI 50.95 kg/m  No intake/output data recorded. No intake/output data recorded.  FHT:  FHR: 130 bpm, variability: moderate,  accelerations:  Present,  decelerations:  Absent UC:   irregular, every 2-6 minutes SVE:   Dilation: 4.5 Effacement (%): 100 Station: 0 Exam by:: Lorn Junes, RN  Labs: Lab Results  Component Value Date   WBC 7.4 02/26/2018   HGB 11.4 (L) 02/26/2018   HCT 34.6 (L) 02/26/2018   MCV 86.9 02/26/2018   PLT 237 02/26/2018    Assessment / Plan: Labor: Progressing normally Preeclampsia:  N/A Fetal Wellbeing:  Category I Pain Control:  Labor support without medications and Nitrous Oxide I/D:  n/a Anticipated MOD:  NSVD  Calvert Cantor, CNM 02/26/2018, 9:31 AM

## 2018-02-26 NOTE — Anesthesia Pain Management Evaluation Note (Signed)
  CR[facility] Pain Management Visit Note  Patient: Melanie Harmon, 27 y.o., female  "Hello I am a member of the anesthesia team at [facility]. We have an anesthesia team available at all times to provide care throughout the hospital, including epidural management and anesthesia for C-section. I don't know your plan for the delivery whether it a natural birth, water birth, IV sedation, nitrous supplementation, doula or epidural, but we want to meet your pain goals."   1.Was your pain managed to your expectations on prior hospitalizations?   No prior hospitalizations  2.What is your expectation for pain management during this hospitalization?     IV pain meds  3.How can we help you reach that goal? unsure  Record the patient's initial score and the patient's pain goal.   Pain: 7  Pain Goal: 8 [facility] wants you to be able to say your pain was always managed very well.  Cephus Shelling 02/26/2018

## 2018-02-26 NOTE — MAU Note (Addendum)
Pt got up to bathroom at around 0310 and felt her membranes rupture, clear fluid. Bloody show. + FM. Cntrx started after that. Now about 3-5 mins. Apart.

## 2018-02-26 NOTE — Anesthesia Preprocedure Evaluation (Signed)
Anesthesia Evaluation  Patient identified by MRN, date of birth, ID band Patient awake    Reviewed: Allergy & Precautions, H&P , NPO status , Patient's Chart, lab work & pertinent test results, reviewed documented beta blocker date and time   Airway Mallampati: II  TM Distance: >3 FB Neck ROM: full    Dental no notable dental hx.    Pulmonary neg pulmonary ROS, former smoker,    Pulmonary exam normal breath sounds clear to auscultation       Cardiovascular negative cardio ROS Normal cardiovascular exam Rhythm:regular Rate:Normal     Neuro/Psych negative neurological ROS  negative psych ROS   GI/Hepatic negative GI ROS, Neg liver ROS,   Endo/Other  negative endocrine ROS  Renal/GU negative Renal ROS  negative genitourinary   Musculoskeletal   Abdominal   Peds  Hematology negative hematology ROS (+)   Anesthesia Other Findings   Reproductive/Obstetrics (+) Pregnancy                             Anesthesia Physical Anesthesia Plan  ASA: III  Anesthesia Plan: Epidural   Post-op Pain Management:    Induction:   PONV Risk Score and Plan:   Airway Management Planned:   Additional Equipment:   Intra-op Plan:   Post-operative Plan:   Informed Consent: I have reviewed the patients History and Physical, chart, labs and discussed the procedure including the risks, benefits and alternatives for the proposed anesthesia with the patient or authorized representative who has indicated his/her understanding and acceptance.       Plan Discussed with:   Anesthesia Plan Comments:         Anesthesia Quick Evaluation  

## 2018-02-26 NOTE — H&P (Signed)
Obstetric History and Physical  Melanie Harmon is a 27 y.o. [id number] with IUP at [redacted]w[redacted]d presenting for SROM and early labor. Patient states she has been having  regular, every ~5 minutes contractions, none vaginal bleeding, ruptured around 0320, clear fluid membranes, with active fetal movement.  Denies PIH symptoms (headache, RUQ pain, vision changes).  Prenatal Course Source of Care: [facility] Dating: By LMP --->  Estimated Date of Delivery: 03/10/18 Pregnancy complications or risks: -fetal macrosomia; normal gtt -cervical insuffiencey s/p cerclage -morbid obesity  Patient Active Problem List   Diagnosis Date Noted  . Cervical cerclage suture present, antepartum 10/02/2017  . Supervision of other high risk pregnancy, antepartum 07/18/2017  . Morbid obesity (HCC) 07/18/2017  . History of cervical incompetence in pregnancy, currently pregnant, unspecified trimester 11/25/2016  . Headaches due to old head injury 10/23/2016   She plans to breastfeed She is undecided for postpartum contraception.   Sono:    , CWD, normal anatomy, cephalic presentation, anterior placenta, 4327g, >90% EFW, BPP 8/8, normal AFI  Prenatal labs and studies: ABO, Rh:  O Pos Antibody:   Neg Rubella:  IMMUNE RPR: Non Reactive (03/07 0845)  HBsAg:   NEGATIVE HIV: Non Reactive (03/07 0845)  UXL:[id number] (05/17 0000) 2 hr Glucola normal Genetic screening declined Anatomy US normal  Prenatal Transfer Tool  Maternal Diabetes: No Genetic Screening: Declined Maternal Ultrasounds/Referrals: Normal Fetal Ultrasounds or other Referrals:  Referred to Materal Fetal Medicine  Maternal Substance Abuse:  No Significant Maternal Medications:  None Significant Maternal Lab Results: None  Past Medical History:  Diagnosis Date  . Fetal demise before 22 weeks with retention of dead fetus    x 2 - one at 18 wks 01/2016, one at 22 wks 11/2016  . GERD (gastroesophageal reflux disease)    with pregnancy only  . Headache     with pregnancy only - otc tylenol med prn  . Missed abortion    x 2 - TAB and MAB (D&C)    Past Surgical History:  Procedure Laterality Date  . CERVICAL CERCLAGE N/A 09/04/2017   Procedure: CERCLAGE CERVICAL;  Surgeon: Willodean Rosenthal, MD;  Location: [facility];  Service: Gynecology;  Laterality: N/A;  . DILATION AND CURETTAGE OF UTERUS     MAB  . Dilation and Curretage    . WISDOM TOOTH EXTRACTION      OB History  Gravida Para Term Preterm AB Living  0  SAB TAB Ectopic Multiple Live Births  2 1   0 1    # Outcome Date GA Lbr Len/2nd Weight Sex Delivery Anes PTL Lv  5 Current           4 Preterm 11/29/16 [redacted]w[redacted]d  14.1 oz (0.4 kg) F Vag-Spont None  ND  3 SAB  [redacted]w[redacted]d         2 SAB           1 TAB             Social History   Socioeconomic History  . Marital status: Single    Spouse name: Not on file  . Number of children: Not on file  . Years of education: Not on file  . Highest education level: Not on file  Occupational History  . Not on file  Social Needs  . Financial resource strain: Not on file  . Food insecurity:    Worry: Not on file    Inability: Not on file  . Transportation needs:  Medical: Not on file    Non-medical: Not on file  Tobacco Use  . Smoking status: Former Smoker    Packs/day: 1.00    Years: 7.00    Pack years: 7.00    Types: Cigarettes    Last attempt to quit: 07/31/2017    Years since quitting: 0.5  . Smokeless tobacco: Never Used  Substance and Sexual Activity  . Alcohol use: No  . Drug use: No  . Sexual activity: Never    Birth control/protection: None    Comment: approx [redacted] wks gestation per patient 08/04/17  Lifestyle  . Physical activity:    Days per week: Not on file    Minutes per session: Not on file  . Stress: Not on file  Relationships  . Social connections:    Talks on phone: Not on file    Gets together: Not on file    Attends religious service: Not on file    Active member of club or organization:  Not on file    Attends meetings of clubs or organizations: Not on file    Relationship status: Not on file  Other Topics Concern  . Not on file  Social History Narrative  . Not on file    Family History  Problem Relation Age of Onset  . Heart attack Maternal Grandmother   . Heart attack Maternal Grandfather     Medications Prior to Admission  Medication Sig Dispense Refill Last Dose  . pantoprazole (PROTONIX) 40 MG tablet Take 1 tablet (40 mg total) by mouth daily. 90 tablet 3 02/25/2018 at Unknown time  . Prenatal Vit-Fe Fumarate-FA (MULTIVITAMIN-PRENATAL) 27-0.8 MG TABS tablet Take 1 tablet by mouth daily at 12 noon.   Past Week at Unknown time    No Known Allergies  Review of Systems: Negative except for what is mentioned in HPI.  Physical Exam: BP 129/62   Pulse (!) 101   Temp 98.3 F (36.8 C) (Oral)   Resp 18   Ht  (1.727 m)   Wt (!) 335 lb 1.9 oz (152 kg)   LMP 06/03/2017   BMI 50.95 kg/m  CONSTITUTIONAL: Well-developed, well-nourished female in no acute distress.  HENT:  Normocephalic, atraumatic, External right and left ear normal. Oropharynx is clear and moist EYES: Conjunctivae and EOM are normal. Pupils are equal, round, and reactive to light. No scleral icterus.  NECK: Normal range of motion, supple, no masses SKIN: Skin is warm and dry. No rash noted. Not diaphoretic. No erythema. No pallor. NEUROLOGIC: Alert and oriented to person, place, and time. Normal reflexes, muscle tone coordination. No cranial nerve deficit noted. PSYCHIATRIC: Normal mood and affect. Normal behavior. Normal judgment and thought content. CARDIOVASCULAR: Normal heart rate noted, regular rhythm RESPIRATORY: Effort and breath sounds normal, no problems with respiration noted ABDOMEN: Soft, nontender, nondistended, gravid. MUSCULOSKELETAL: Normal range of motion. No edema and no tenderness. 2+ distal pulses.  Cervical Exam: Dilation: 3 Effacement (%): 80 Station:  -1 Presentation: Vertex Exam by:: Roxan Hockey RN    Presentation: cephalic FHT:  Baseline rate 135 bpm   Variability moderate  Accelerations present   Decelerations none Contractions: Every 1-3 mins   Pertinent Labs/Studies:   Results for orders placed or performed during the hospital encounter of 02/26/18 (from the past 24 hour(s))  Urinalysis, Routine w reflex microscopic     Status: Abnormal   Collection Time: 02/26/18  4:46 AM  Result Value Ref Range   Color, Urine YELLOW YELLOW   APPearance  HAZY (A) CLEAR   Specific Gravity, Urine 1.009 1.005 - 1.030   pH 7.0 5.0 - 8.0   Glucose, UA NEGATIVE NEGATIVE mg/dL   Hgb urine dipstick MODERATE (A) NEGATIVE   Bilirubin Urine NEGATIVE NEGATIVE   Ketones, ur NEGATIVE NEGATIVE mg/dL   Protein, ur NEGATIVE NEGATIVE mg/dL   Nitrite NEGATIVE NEGATIVE   Leukocytes, UA TRACE (A) NEGATIVE   RBC / HPF 0-5 0 - 5 RBC/hpf   WBC, UA 11-20 0 - 5 WBC/hpf   Bacteria, UA RARE (A) NONE SEEN   Squamous Epithelial / LPF 6-10 0 - 5   Mucus PRESENT   Protein / creatinine ratio, urine     Status: Abnormal   Collection Time: 02/26/18  4:46 AM  Result Value Ref Range   Creatinine, Urine 50.00 mg/dL   Total Protein, Urine 9 mg/dL   Protein Creatinine Ratio 0.18 (H) 0.00 - 0.15 mg/mg[Cre]  CBC     Status: Abnormal   Collection Time: 02/26/18  5:35 AM  Result Value Ref Range   WBC 7.4 4.0 - 10.5 K/uL   RBC 3.98 3.87 - 5.11 MIL/uL   Hemoglobin 11.4 (L) 12.0 - 15.0 g/dL   HCT 16.1 (L) 09.6 - 04.5 %   MCV 86.9 78.0 - 100.0 fL   MCH 28.6 26.0 - 34.0 pg   MCHC 32.9 30.0 - 36.0 g/dL   RDW 40.9 81.1 - 91.4 %   Platelets 237 150 - 400 K/uL  Comprehensive metabolic panel     Status: Abnormal   Collection Time: 02/26/18  5:35 AM  Result Value Ref Range   Sodium 136 135 - 145 mmol/L   Potassium 4.0 3.5 - 5.1 mmol/L   Chloride 105 101 - 111 mmol/L   CO2 19 (L) 22 - 32 mmol/L   Glucose, Bld 97 65 - 99 mg/dL   BUN 9 6 - 20 mg/dL   Creatinine, Ser 7.82 0.44  - 1.00 mg/dL   Calcium 9.0 8.9 - 95.6 mg/dL   Total Protein 6.5 6.5 - 8.1 g/dL   Albumin 2.9 (L) 3.5 - 5.0 g/dL   AST 17 15 - 41 U/L   ALT 12 (L) 14 - 54 U/L   Alkaline Phosphatase 111 38 - 126 U/L   Total Bilirubin 0.3 0.3 - 1.2 mg/dL   GFR calc non Af Amer >60 >60 mL/min   GFR calc Af Amer >60 >60 mL/min   Anion gap 12 5 - 15  POCT fern test     Status: Normal   Collection Time: 02/26/18  5:57 AM  Result Value Ref Range   POCT Fern Test Positive = ruptured amniotic membanes     Assessment : Melanie Harmon is a 27 y.o. [id number] at [redacted]w[redacted]d being admitted for latent labor with SROM. Some elevated BPs on admission will collect labs.  Plan: Labor: Expectant management. Will let patient naturally progress. Augmentation as needed if not making change.  Analgesia as needed. FWB: Reassuring fetal heart tracing.  GBS negative Delivery plan: Hopeful for vaginal delivery   Caryl Ada, DO OB Fellow Faculty Practice, [facility] [place] 02/26/2018, 6:46 AM

## 2018-02-26 NOTE — Progress Notes (Signed)
Tamila Gaulin is a 27 y.o. [name] at [redacted]w[redacted]d by LMP admitted for rupture of membranes  Subjective: Patient resting in bed comfortably, no complaints at this time  Objective: BP 134/70   Pulse (!) 105   Temp 98 F (36.7 C) (Oral)   Resp 18   Ht  (1.727 m)   Wt (!) 152 kg (335 lb 1.9 oz)   LMP 06/03/2017   BMI 50.95 kg/m  No intake/output data recorded. No intake/output data recorded.  FHT:  FHR: 135 bpm, variability: moderate,  accelerations:  Present,  decelerations:  Absent UC:   irregular, every 3-4 minutes SVE:   Dilation: 4.5 Effacement (%): 100 Station: 0 Exam by:: Lorn Junes, RN  Labs: Lab Results  Component Value Date   WBC 7.4 02/26/2018   HGB 11.4 (L) 02/26/2018   HCT 34.6 (L) 02/26/2018   MCV 86.9 02/26/2018   PLT 237 02/26/2018    Assessment / Plan: Spontaneous labor, progressing normally  Labor: Progressing normally, SROM @ 0310, IUPC placed at 11:00 AM Fetal Wellbeing:  Category I Pain Control:  IV pain meds and Nitrous Oxide I/D:  n/a Anticipated MOD:  NSVD  Felisa Bonier, MD, PGY-1 Family Medicine- [facility] 02/26/2018, 11:15 AM

## 2018-02-26 NOTE — Progress Notes (Signed)
Melanie Harmon is a 27 y.o. [name] at [redacted]w[redacted]d by LMP admitted for rupture of membranes  Subjective: Patient is resting in bed, comfortable on IV fentanyl  Objective: BP 133/76   Pulse (!) 114   Temp 98.4 F (36.9 C) (Oral)   Resp 18   Ht  (1.727 m)   Wt (!) 152 kg (335 lb 1.9 oz)   LMP 06/03/2017   BMI 50.95 kg/m  No intake/output data recorded. No intake/output data recorded.  FHT:  FHR: 130 bpm, variability: moderate,  accelerations:  Present,  decelerations:  Absent UC:   regular, every 2-3 minutes, MVU ~ 170 SVE:   Dilation: 5 Effacement (%): 100 Station: 0 Exam by:: Lorn Junes, RN  Labs: Lab Results  Component Value Date   WBC 7.4 02/26/2018   HGB 11.4 (L) 02/26/2018   HCT 34.6 (L) 02/26/2018   MCV 86.9 02/26/2018   PLT 237 02/26/2018    Assessment / Plan: Spontaneous labor, progressing normally  Labor: SROM @ 0310, IUPC placed at 11:00 AM.  Progressing on Pitocin, will continue to increase given suboptimal MVU Preeclampsia:  no signs or symptoms of toxicity Fetal Wellbeing:  Category I Pain Control:  IV pain meds and Nitrous Oxide I/D:  n/a Anticipated MOD:  NSVD  Felisa Bonier, MD, PGY-1 Family Medicine - [facility] 02/26/2018, 1:26 PM

## 2018-02-26 NOTE — Progress Notes (Signed)
Melanie Harmon is a 27 y.o. [id number] at [redacted]w[redacted]d by LMP admitted for rupture of membranes at 0300AM  Subjective: Patient is resting in bed comfortably, tolerating contractions well on epidural, reports feeling increased pelvic pressure  Objective: BP (!) 142/84   Pulse (!) 105   Temp 97.6 F (36.4 C) (Oral)   Resp 16   Ht  (1.727 m)   Wt (!) 152 kg (335 lb 1.9 oz)   LMP 06/03/2017   BMI 50.95 kg/m  No intake/output data recorded. No intake/output data recorded.  FHT:  FHR: 140 bpm, variability: moderate,  accelerations:  Present,  decelerations:  Absent UC:   regular, every 1-2 minutes SVE:   Dilation: 5 Effacement (%): 100 Station: 0 Exam by:: Lorn Junes, RN  Labs: Lab Results  Component Value Date   WBC 7.4 02/26/2018   HGB 11.4 (L) 02/26/2018   HCT 34.6 (L) 02/26/2018   MCV 86.9 02/26/2018   PLT 237 02/26/2018    Assessment / Plan: Augmentation of labor, s/p SROM, progressing well  Labor: SROM , IUPC in place. Progressing on Pitocin, will continue to increase as appropriate Fetal Wellbeing:  Category I Pain Control:  Epidural I/D:  n/a Anticipated MOD:  NSVD  Felisa Bonier, MD, PGY-1 Family Medicine - [facility] [place] 02/26/2018, 4:01 PM

## 2018-02-26 NOTE — Progress Notes (Signed)
Pharmacy Antibiotic Note  Melanie Harmon is a 27 y.o. female admitted on 02/26/2018 with SROM at [redacted]w[redacted]d gestation.  Pharmacy has been consulted for Gentamicin dosing for Triple I / Chorioamnionitis  Plan: Gentamicin  IV q8h Will continue to follow and assess need for further kinetic workup  Height:  (172.7 cm) Weight: (!) 335 lb 1.9 oz (152 kg) IBW/kg (Calculated) : 63.9  Adjusted BW/ Dosing BW: 90.3kg  Temp (24hrs), Avg:98.4 F (36.9 C), Min:97.6 F (36.4 C), Max:100.4 F (38 C)  Recent Labs  Lab 02/26/18 0535  WBC 7.4  CREATININE 0.44    Estimated Creatinine Clearance: 166.7 mL/min (by C-G formula based on SCr of 0.44 mg/dL).    No Known Allergies  Antimicrobials this admission: Ampicillin 2 gram IV q6h  5/30 >>    Thank you for allowing pharmacy to be a part of this patient's care.  Claybon Jabs 02/26/2018 9:44 PM

## 2018-02-26 NOTE — Anesthesia Procedure Notes (Signed)
Epidural Patient location during procedure: OB Start time: 02/26/2018 3:00 PM End time: 02/26/2018 3:10 PM  Staffing Anesthesiologist: Bethena Midget, MD  Preanesthetic Checklist Completed: patient identified, site marked, surgical consent, pre-op evaluation, timeout performed, IV checked, risks and benefits discussed and monitors and equipment checked  Epidural Patient position: sitting Prep: site prepped and draped and DuraPrep Patient monitoring: continuous pulse ox and blood pressure Approach: midline Location: L4-L5 Injection technique: LOR air  Needle:  Needle type: Tuohy  Needle gauge: 17 G Needle length: 9 cm and 9 Needle insertion depth: 9 cm Catheter type: closed end flexible Catheter size: 19 Gauge Catheter at skin depth: 14 cm Test dose: negative  Assessment Events: blood not aspirated, injection not painful, no injection resistance, negative IV test and no paresthesia

## 2018-02-27 ENCOUNTER — Encounter (HOSPITAL_COMMUNITY): Payer: Self-pay

## 2018-02-27 ENCOUNTER — Ambulatory Visit: Payer: Medicaid Other

## 2018-02-27 DIAGNOSIS — O411230 Chorioamnionitis, third trimester, not applicable or unspecified: Secondary | ICD-10-CM

## 2018-02-27 DIAGNOSIS — Z3A38 38 weeks gestation of pregnancy: Secondary | ICD-10-CM

## 2018-02-27 DIAGNOSIS — O3663X0 Maternal care for excessive fetal growth, third trimester, not applicable or unspecified: Secondary | ICD-10-CM

## 2018-02-27 DIAGNOSIS — O134 Gestational [pregnancy-induced] hypertension without significant proteinuria, complicating childbirth: Secondary | ICD-10-CM

## 2018-02-27 MED ORDER — SCOPOLAMINE 1 MG/3DAYS TD PT72
1.0000 | MEDICATED_PATCH | Freq: Once | TRANSDERMAL | Status: DC
  Filled 2018-02-27: qty 1

## 2018-02-27 MED ORDER — NALOXONE HCL 4 MG/10ML IJ SOLN
1.0000 ug/kg/h | INTRAVENOUS | Status: DC | PRN
  Filled 2018-02-27: qty 5

## 2018-02-27 MED ORDER — NALBUPHINE HCL 10 MG/ML IJ SOLN: 5.0000 mg | INTRAMUSCULAR | Status: DC | PRN

## 2018-02-27 MED ORDER — COCONUT OIL OIL
1.0000 "application " | TOPICAL_OIL | Status: DC | PRN
  Administered 2018-02-27: 1 via TOPICAL
  Filled 2018-02-27: qty 120

## 2018-02-27 MED ORDER — DIPHENHYDRAMINE HCL 25 MG PO CAPS: 25.0000 mg | ORAL_CAPSULE | ORAL | Status: DC | PRN

## 2018-02-27 MED ORDER — ONDANSETRON HCL 4 MG/2ML IJ SOLN: 4.0000 mg | INTRAMUSCULAR | Status: DC | PRN

## 2018-02-27 MED ORDER — SENNOSIDES-DOCUSATE SODIUM 8.6-50 MG PO TABS
2.0000 | ORAL_TABLET | ORAL | Status: DC
  Administered 2018-02-28 (×2): 2 via ORAL
  Filled 2018-02-27 (×2): qty 2

## 2018-02-27 MED ORDER — ZOLPIDEM TARTRATE 5 MG PO TABS: 5.0000 mg | ORAL_TABLET | Freq: Every evening | ORAL | Status: DC | PRN

## 2018-02-27 MED ORDER — PRENATAL MULTIVITAMIN CH
1.0000 | ORAL_TABLET | Freq: Every day | ORAL | Status: DC
  Administered 2018-02-27 – 2018-02-28 (×2): 1 via ORAL
  Filled 2018-02-27 (×2): qty 1

## 2018-02-27 MED ORDER — DIPHENHYDRAMINE HCL 50 MG/ML IJ SOLN: 12.5000 mg | INTRAMUSCULAR | Status: DC | PRN

## 2018-02-27 MED ORDER — BENZOCAINE-MENTHOL 20-0.5 % EX AERO
1.0000 "application " | INHALATION_SPRAY | CUTANEOUS | Status: DC | PRN
  Administered 2018-02-27 – 2018-03-01 (×2): 1 via TOPICAL
  Filled 2018-02-27 (×2): qty 56

## 2018-02-27 MED ORDER — SIMETHICONE 80 MG PO CHEW: 80.0000 mg | CHEWABLE_TABLET | ORAL | Status: DC | PRN

## 2018-02-27 MED ORDER — NALBUPHINE HCL 10 MG/ML IJ SOLN: 5.0000 mg | Freq: Once | INTRAMUSCULAR | Status: DC | PRN

## 2018-02-27 MED ORDER — ACETAMINOPHEN 325 MG PO TABS: 650.0000 mg | ORAL_TABLET | ORAL | Status: DC | PRN

## 2018-02-27 MED ORDER — IBUPROFEN 600 MG PO TABS
600.0000 mg | ORAL_TABLET | Freq: Four times a day (QID) | ORAL | Status: DC
  Administered 2018-02-27 – 2018-03-01 (×9): 600 mg via ORAL
  Filled 2018-02-27 (×9): qty 1

## 2018-02-27 MED ORDER — DIBUCAINE 1 % RE OINT: 1.0000 "application " | TOPICAL_OINTMENT | RECTAL | Status: DC | PRN

## 2018-02-27 MED ORDER — TETANUS-DIPHTH-ACELL PERTUSSIS 5-2.5-18.5 LF-MCG/0.5 IM SUSP: 0.5000 mL | Freq: Once | INTRAMUSCULAR | Status: DC

## 2018-02-27 MED ORDER — ONDANSETRON HCL 4 MG PO TABS: 4.0000 mg | ORAL_TABLET | ORAL | Status: DC | PRN

## 2018-02-27 MED ORDER — DIPHENHYDRAMINE HCL 25 MG PO CAPS: 25.0000 mg | ORAL_CAPSULE | Freq: Four times a day (QID) | ORAL | Status: DC | PRN

## 2018-02-27 MED ORDER — WITCH HAZEL-GLYCERIN EX PADS: 1.0000 "application " | MEDICATED_PAD | CUTANEOUS | Status: DC | PRN

## 2018-02-27 NOTE — Lactation Note (Signed)
This note was copied from a baby's chart. Lactation Consultation Note:  NICU staff nurse paged for assistance with latching infant that is 6 hours old . She reports that infant has not had a feeding and if infant will have a good feeding he can be transferred back to mothers room.  Mother wants very much to exclusively breastfeed. She has been in the NICU for long periods doing skin to skin. Infant sleeping on mothers chest when I arrived to NICU. Attempt to rouse infant but he is not interested in feeding . Assist mother with hand expression. Colostrum flowing good. Infant was fed total of 8 ml of ebm with a spoon.  Infant placed at the breast for several attempts and not showing feeding cues. Attempt to do suck training. Infant tugged on gloved finger and began suckling. Infant has a slightly high palate. Mothers nipples are semi-flat. She was taught to firm with her fingers and sandwich the breast.  Infant placed in football hold and began suckling. Infant sustained latch for 20 mins while I observed. Observed frequent strong suckling and swallows.  Basic breastfeeding teaching done. Mother taught hand expression, breast compression. Mother advised to breastfeed infant 8-12 times in 24 hours and with all feeding cues. Encouraged mother to do frequent skin to skin. Mother receptive to all teaching.   Patient Name: Melanie Harmon [id number] Date: 02/27/2018 Reason for consult: Initial assessment   Maternal Data Has patient been taught Hand Expression?: Yes Does the patient have breastfeeding experience prior to this delivery?: No  Feeding Feeding Type: Breast Fed Length of feed: 20 min(infant still at the breast when I left pt side)  LATCH Score Latch: Repeated attempts needed to sustain latch, nipple held in mouth throughout feeding, stimulation needed to elicit sucking reflex.  Audible Swallowing: Spontaneous and intermittent  Type of Nipple: Flat(firms slightly with stimulation)  Comfort  (Breast/Nipple): Soft / non-tender  Hold (Positioning): Assistance needed to correctly position infant at breast and maintain latch.  LATCH Score: 7  Interventions Interventions: Breast feeding basics reviewed;Assisted with latch;Skin to skin;Hand express;Breast compression;Adjust position;Support pillows;Expressed milk  Lactation Tools Discussed/Used WIC Program: Yes   Consult Status Consult Status: Follow-up Date: 02/28/18 Follow-up type: In-patient    Stevan Born [facility] 02/27/2018, 9:26 AM

## 2018-02-27 NOTE — Progress Notes (Signed)
Patient note in room; infant is in the NICU. Will come back later to check on her.  Cornelia Copaharlie Yohan Samons, Jr MD Attending Center for [facility] 02/27/2018 Time: [phone]

## 2018-02-27 NOTE — Progress Notes (Signed)
Daily Post Partum Note  02/27/2018 Melanie Harmon is a 27 y.o. [id number] PPD#0 s/p  SVD/1st (repaired)  @ [redacted]w[redacted]d.  Pregnancy c/b BMI 50, GERD Delivery c/b chorio, gHTN in labor 24hr/overnight events:  none  Subjective:  Pt doing well and meeting all PP goals. No s/s of pre-eclampsia.  Objective:    Current Vital Signs 24h Vital Sign Ranges  T 98.1 F (36.7 C) Temp  Avg: 98.5 F (36.9 C)  Min: 97.8 F (36.6 C)  Max: 100.4 F (38 C)  BP 109/64 BP  Min: 97/46  Max: 158/71  HR (!) 108 Pulse  Avg: 122.7  Min: 103  Max: 137  RR 18 Resp  Avg: 17  Min: 16  Max: 20  SaO2 97 % Room Air SpO2  Avg: 97 %  Min: 97 %  Max: 97 %       24 Hour I/O Current Shift I/O  Time Ins Outs 05/30 0701 - 05/31 0700 In: -  Out: 1600 [Urine:1250] No intake/output data recorded.    General: NAD Abdomen: obese, nttp, nd. Perineum: deferred Skin:  Warm and dry.  Respiratory:  Normal respiratory effort  Medications Current Facility-Administered Medications  Medication Dose Route Frequency Provider Last Rate Last Dose  . acetaminophen (TYLENOL) tablet 650 mg  650 mg Oral Q4H PRN Caryl Ada Y, DO      . benzocaine-Menthol (DERMOPLAST) 20-0.5 % topical spray 1 application  1 application Topical PRN Pincus Large, DO   1 application at 02/27/18 1158  . coconut oil  1 application Topical PRN Pincus Large, DO   1 application at 02/27/18 1158  . witch hazel-glycerin (TUCKS) pad 1 application  1 application Topical PRN Pincus Large, DO       And  . dibucaine (NUPERCAINAL) 1 % rectal ointment 1 application  1 application Rectal PRN Pincus Large, DO      . diphenhydrAMINE (BENADRYL) injection 12.5 mg  12.5 mg Intravenous Q4H PRN Bethena Midget, MD       Or  . diphenhydrAMINE (BENADRYL) capsule 25 mg  25 mg Oral Q4H PRN Bethena Midget, MD      . diphenhydrAMINE (BENADRYL) capsule 25 mg  25 mg Oral Q6H PRN Caryl Ada Y, DO      . ibuprofen (ADVIL,MOTRIN) tablet 600 mg  600 mg Oral Q6H Phelps, Jazma Y,  DO   600 mg at 02/27/18 1157  . lidocaine (PF) (XYLOCAINE) 1 % injection 30 mL  30 mL Subcutaneous PRN Caryl Ada Y, DO   30 mL at 02/27/18 0135  . nalbuphine (NUBAIN) injection 5 mg  5 mg Intravenous Q4H PRN Bethena Midget, MD       Or  . nalbuphine (NUBAIN) injection 5 mg  5 mg Subcutaneous Q4H PRN Bethena Midget, MD      . nalbuphine (NUBAIN) injection 5 mg  5 mg Intravenous Once PRN Bethena Midget, MD       Or  . nalbuphine (NUBAIN) injection 5 mg  5 mg Subcutaneous Once PRN Bethena Midget, MD      . naloxone HCl (NARCAN) 2 mg in dextrose 5 % 250 mL infusion  1-4 mcg/kg/hr Intravenous Continuous PRN Bethena Midget, MD      . ondansetron [facility]) tablet 4 mg  4 mg Oral Q4H PRN Pincus Large, DO       Or  . ondansetron (ZOFRAN) injection 4 mg  4 mg Intravenous Q4H PRN Pincus Large, DO      .  prenatal multivitamin tablet 1 tablet  1 tablet Oral Q1200 Pincus Large, DO   1 tablet at 02/27/18 1157  . scopolamine (TRANSDERM-SCOP) 1 MG/3DAYS 1.5 mg  1 patch Transdermal Once Bethena Midget, MD      . Melene Muller ON 02/28/2018] senna-docusate (Senokot-S) tablet 2 tablet  2 tablet Oral Q24H Caryl Ada Y, DO      . simethicone (MYLICON) chewable tablet 80 mg  80 mg Oral PRN Pincus Large, DO      . [START ON 02/28/2018] Tdap (BOOSTRIX) injection 0.5 mL  0.5 mL Intramuscular Once Phelps, Jazma Y, DO      . zolpidem (AMBIEN) tablet 5 mg  5 mg Oral QHS PRN Pincus Large, DO        Labs:  Recent Labs  Lab 02/26/18 0535  WBC 7.4  HGB 11.4*  HCT 34.6*  PLT 237   Recent Labs  Lab 02/26/18 0535  NA 136  K 4.0  CL 105  CO2 19*  BUN 9  CREATININE 0.44  GLUCOSE 97  CALCIUM 9.0    Assessment & Plan:  Doing well *Postpartum/postop: routine care. Infant in the room now. O pos. Breast. Undecided on BC. Desires circ, trying to get baby onto dad's private insurance. D/w them that if can't get done in house can set up outpatient options.  *gHTN: no issues. *Dispo: d/w pt that if doing  well and baby can go home tomorrow that it'd be fine to go home PPD#1 since she delivered a little after MN  Cornelia Copa. MD Attending Center for [facility])

## 2018-02-27 NOTE — Anesthesia Postprocedure Evaluation (Signed)
Anesthesia Post Note  Patient: Melanie Harmon  Procedure(s) Performed: AN AD HOC LABOR EPIDURAL     Patient location during evaluation: Women's Unit Anesthesia Type: Epidural Level of consciousness: awake and alert Pain management: pain level controlled Vital Signs Assessment: post-procedure vital signs reviewed and stable Respiratory status: spontaneous breathing, nonlabored ventilation and respiratory function stable Cardiovascular status: stable Postop Assessment: no headache, no backache, epidural receding, no apparent nausea or vomiting, patient able to bend at knees, able to ambulate and adequate PO intake Anesthetic complications: no    Last Vitals:  Vitals:   02/27/18 1223 02/27/18 1400  BP: 109/64 114/75  Pulse: (!) 108 100  Resp: 18 18  Temp: 36.7 C 36.4 C  SpO2:      Last Pain:  Vitals:   02/27/18 1400  TempSrc: Oral  PainSc:    Pain Goal:                 [employer]

## 2018-02-27 NOTE — Lactation Note (Signed)
This note was copied from a baby's chart. Follow up with mom in her room. Infant has been transferred back to mom's room, mom very pleased. Mom reports she has no questions/concerns at this time.   BF Resources handout and LC Brochure given at request of Stevan Born, LC.   Silas Flood Hice 02/27/2018, 1:32 PM

## 2018-02-27 NOTE — Progress Notes (Signed)
Melanie Harmon is a 27 y.o. Z6X0960 at [redacted]w[redacted]d by LMP admitted for rupture of membranes at 0300AM on 5/30.  Subjective: Feeling contractions more. Resting with epidural  Objective: BP 104/83   Pulse (!) 125   Temp 98 F (36.7 C) (Axillary)   Resp 16   Ht  (1.727 m)   Wt (!) 335 lb 1.9 oz (152 kg)   LMP 06/03/2017   BMI 50.95 kg/m  No intake/output data recorded. Total I/O In: -  Out: 1050 [Urine:1050]  FHT:  FHR: 145 bpm, variability: moderate,  accelerations:  Present,  decelerations:  Absent UC: irregular, every 2-5 minutes SVE:   Dilation: 10 Effacement (%): 100 Station: Plus 1, Plus 2 Exam by:: Lahoma Crocker, RN  Pitocin /min  Labs: Lab Results  Component Value Date   WBC 7.4 02/26/2018   HGB 11.4 (L) 02/26/2018   HCT 34.6 (L) 02/26/2018   MCV 86.9 02/26/2018   PLT 237 02/26/2018    Assessment / Plan: Augmentation of labor, s/p SROM Progressing normally  Labor: complete. Will start trying to push. Fetal Wellbeing:  Category I Pain Control:  Epidural I/D:  Developed Triple I. Will treat with Amg/Gent Anticipated MOD:  NSVD  Caryl Ada, DO OB Fellow Center for [facility], [facility]

## 2018-02-28 MED ORDER — FUROSEMIDE 20 MG PO TABS
20.0000 mg | ORAL_TABLET | Freq: Once | ORAL | Status: AC
  Administered 2018-02-28: 20 mg via ORAL
  Filled 2018-02-28: qty 1

## 2018-02-28 NOTE — Lactation Note (Addendum)
This note was copied from a baby's chart. Lactation Consultation Note  Patient Name: Melanie Gifford ShaveZari Regino IONGE'XToday's Date: 02/28/2018 Reason for consult: Follow-up assessment;Early term 37-38.6wks;Primapara;1st time breastfeeding  8436 hours old early term female who is being exclusively BF by his mother, she's a P1. Baby is at 5% weight loss. Mom had several losses in the past and she's very concerned about the well being of this baby, this is her first born. Mom was trying to feed baby when entering the room, offered assistance with latch, mom requested to latch baby on the right breast (the challenging one). Mom hand expressed some colostrum before LC took baby STS in football position to the right breast; but baby did not latch on, he kept pulling away from the breast and crying. LC did some suck training but baby's suck was uncoordinated, he would clamp and suck, once he got into a steady rhythmical pattern, baby's clamping and sucking motion became more evident; when LC transition baby to the breast, baby would start crying again. Noted some degree of restriction on baby's tongue when he was crying, the tongue would not extend past the gum line. Advised parents to let baby's pediatrician know about this finding.  Got mom sized into a # 16 NS and tried again on the right breast in football position, baby would latch this time but no sucking reflex elicit. Tried on the other breast and same thing happened, baby did not latch, per mom baby has only latch to the left breast a few times, not every single one. Set up a DEBP and instructed mom to pump every 3 hours and feed any amount of colostrum she may get with finger feeding. At the end of pumping session mom only got drops from the right breast, LC showed parents how to finger feed. Mom's nipples are almost flat, LC gave mom some breast shells as well. Pump, shells, and NS instructions, cleaning and storage were reviewed.  Encouraged mom to keep attempting feeding  baby 8-12 times/24 hours or sooner if feeding cues are present. If baby is not cueing in a 3 hour period, mom will wake baby up for feedings. She'll apply coconut oil prior pumping, and will pump every 3 hours after feedings/attemps. If baby is still not latching on or mom is not getting at least 5-10 ml of colostrum, baby will need to get supplemented on the next feeding; no later than tonight. [facility], the formula supplement to be used will be [employer] Gentle. RN is aware of feeding plan and will bring formula to mom as soon as baby starts showing hunger cues if no colostrum is available. Both parents are aware of LC services and will call PRN.  Maternal Data    Feeding Feeding Type: Breast Fed(attempt)  Interventions Interventions: Breast feeding basics reviewed;Assisted with latch;Breast massage;Hand express;Breast compression;Adjust position;Support pillows;Position options;Coconut oil;Expressed milk;Shells;DEBP  Lactation Tools Discussed/Used Tools: Shells;Pump;Coconut oil;Nipple Shields Nipple shield size: 16 Breast pump type: Double-Electric Breast Pump Pump Review: Setup, frequency, and cleaning;Milk Storage Initiated by:: MPeck Date initiated:: 02/28/18   Consult Status Consult Status: Follow-up Date: 03/01/18 Follow-up type: In-patient    Melanie Harmon 02/28/2018, 1:35 PM

## 2018-02-28 NOTE — Progress Notes (Signed)
Post Partum Day 1 Subjective: no complaints, up ad lib, voiding and tolerating PO  Having some leg edema. States that it hurts to ambulate sometimes.   Objective: Blood pressure 129/78, pulse 82, temperature (!) 97.3 F (36.3 C), resp. rate 18, height 5\' 8"  (1.727 m), weight (!) 152 kg (335 lb 1.9 oz), last menstrual period 06/03/2017, SpO2 98 %, unknown if currently breastfeeding.  Physical Exam:  General: alert, cooperative and no distress Lochia: appropriate Uterine Fundus: firm DVT Evaluation: No evidence of DVT seen on physical exam. No cords or calf tenderness. Calf/Ankle edema is present.  Recent Labs    02/26/18 0535  HGB 11.4*  HCT 34.6*    Assessment/Plan: Plan for discharge tomorrow, Breastfeeding and Lactation consult  PO Lasix for diuresis  VSS   LOS: 2 days   Caryl AdaJazma Danessa Mensch, DO 02/28/2018, 8:01 AM

## 2018-02-28 NOTE — Lactation Note (Signed)
This note was copied from a baby's chart. Lactation Consultation Note  Patient Name: [name]'YToday's Date: 02/28/2018    RN Request for Latch Assistance:  P1 mother who has not had any luck getting baby to breastfeed today.  She has just recently been set up with the DEBP from the previous shift LC.  Mother instructed to pump every 3 hours and to feed back any EBM she obtains.  Mother will continue to do this throughout the night.  Mother had just finished trying to get baby latched when I arrived.  Infant sleeping in football position with mother.  Offered to assist and mother accepted.  Mother's nipples are short shafted bilaterally and she does have breast shells.  I also provided a manual pump with instructions for use.  Cleaning of pump parts and assembly discussed.  I agree with previous assessment by LC.  This baby does have a tongue that does not extend past the lower gum line and is somewhat restricted in movement.  His suck progressively improved with finger training on my gloved finger and by using gently pressure to squeeze his cheeks together.  He was able to maintain a good suck after a couple of minutes of training.  Since his mouth is smaller I suggested using the #16 NS for tonight.  Reviewed with mother the correct way to apply it and assisted baby to latch in the football hold on the right breast.  He was fussy at first but with encouragement took quickly to the NS and sucked effectively.  I used [employer] MonsGerber Good Start and a curved tip syringe to insert formula into the shield.  He easily took 10 mls and father assisted with filling the NS.  Parents encouraged by this feeding and will continue using this technique throughout the night.  Attempted to latch onto the right breast without the NS after baby had received the formula but he was too sleepy.  Mother continued doing STS while father washed equipment.  Mother will pump with DEBP now and again after every 3 hour feed.  RN  came into room and was updated on the feeding.  Mother will call for assistance as needed.                 Melanie Harmon 02/28/2018, 9:50 PM

## 2018-03-01 MED ORDER — NIFEDIPINE ER 30 MG PO TB24: 30.0000 mg | ORAL_TABLET | Freq: Two times a day (BID) | ORAL | 0 refills | Status: DC

## 2018-03-01 MED ORDER — IBUPROFEN 600 MG PO TABS: 600.0000 mg | ORAL_TABLET | Freq: Four times a day (QID) | ORAL | 0 refills | Status: DC

## 2018-03-01 MED ORDER — NIFEDIPINE ER OSMOTIC RELEASE 30 MG PO TB24
30.0000 mg | ORAL_TABLET | Freq: Two times a day (BID) | ORAL | Status: DC
  Administered 2018-03-01: 30 mg via ORAL
  Filled 2018-03-01: qty 1

## 2018-03-01 NOTE — Plan of Care (Signed)
Patient remained free of infection,skin integrity remains intact and pt is coping well at this time.

## 2018-03-01 NOTE — Discharge Summary (Signed)
OB Discharge Summary     Patient Name: Melanie Harmon DOB: 11-05-1990 MRN: [id number]  Date of admission: 02/26/2018 Delivering MD: Pincus LargePHELPS, JAZMA Y   Date of discharge: 03/01/2018  Admitting diagnosis: 38 WEEKS ROM CTX Intrauterine pregnancy: 9259w3d     Secondary diagnosis:  Active Problems:   SVD (spontaneous vaginal delivery)    Discharge diagnosis: Term Pregnancy Delivered                                   Hospital course:  Onset of Labor With Vaginal Delivery     27 y.o. yo [id number] at 5959w3d was admitted in Latent Labor with SROM on 02/26/2018. Patient had an uncomplicated labor course as follows:  Membrane Rupture Time/Date: 3:10 AM ,02/26/2018   Intrapartum Procedures: Episiotomy: None [1]                                         Lacerations:  1st degree [2]  Patient had a delivery of a Viable infant. 02/27/2018  Information for the patient's newborn:  Conard NovakRoush, Boy Nazly [[id number]][[id number]]  Delivery Method: Vag-Spont    Pateint had an uncomplicated postpartum course.  She is ambulating, tolerating a regular diet, passing flatus, and urinating well. Patient is discharged home in stable condition on 03/01/18. Patient noted to have some elevated BP without symptoms (denies headaches, visual changes, RUQ/epigastric pain) Rx procardia provided   Physical exam  Vitals:   02/28/18 1619 02/28/18 2001 02/28/18 2315 03/01/18 0334  BP: (!) 146/78 130/75 126/63 (!) 143/89  Pulse: 94 100 82 90  Resp: 18 19  19   Temp: 98.7 F (37.1 C) 98.4 F (36.9 C) 98.3 F (36.8 C) 98.4 F (36.9 C)  TempSrc: Oral Axillary Oral Oral  SpO2: 99% 99% 99% 98%  Weight:      Height:       General: alert, cooperative and no distress Lochia: appropriate Uterine Fundus: firm DVT Evaluation: No evidence of DVT seen on physical exam. Bilateral pitting edema to mid-thigh level  Labs: Lab Results  Component Value Date   WBC 7.4 02/26/2018   HGB 11.4 (L) 02/26/2018   HCT 34.6 (L) 02/26/2018   MCV 86.9 02/26/2018    PLT 237 02/26/2018   CMP Latest Ref Rng & Units 02/26/2018  Glucose 65 - 99 mg/dL 97  BUN 6 - 20 mg/dL 9  Creatinine 1.300.44 - 8.651.00 mg/dL 7.840.44  Sodium 696135 - 295145 mmol/L 136  Potassium 3.5 - 5.1 mmol/L 4.0  Chloride 101 - 111 mmol/L 105  CO2 22 - 32 mmol/L 19(L)  Calcium 8.9 - 10.3 mg/dL 9.0  Total Protein 6.5 - 8.1 g/dL 6.5  Total Bilirubin 0.3 - 1.2 mg/dL 0.3  Alkaline Phos 38 - 126 U/L 111  AST 15 - 41 U/L 17  ALT 14 - 54 U/L 12(L)    Discharge instruction: per After Visit Summary and "Baby and Me Booklet".  After visit meds:  Allergies as of 03/01/2018   No Known Allergies     Medication List    STOP taking these medications   pantoprazole 40 MG tablet Commonly known as:  PROTONIX     TAKE these medications   ibuprofen 600 MG tablet Commonly known as:  ADVIL,MOTRIN Take 1 tablet (600 mg total) by mouth every 6 (six) hours.   multivitamin-prenatal  27-0.8 MG Tabs tablet Take 1 tablet by mouth daily at 12 noon.   NIFEdipine 30 MG 24 hr tablet Commonly known as:  PROCARDIA-XL/ADALAT CC Take 1 tablet (30 mg total) by mouth 2 (two) times daily.       Diet: routine diet  Activity: Advance as tolerated. Pelvic rest for 6 weeks.   Outpatient follow up: 1 week for BP check Follow up Appt: Future Appointments  Date Time Provider Department Center  04/09/2018 10:30 AM Levie Heritage, DO [facility] None   Follow up Visit:No follow-ups on file.  Postpartum contraception: Undecided  Newborn Data: Live born female  Birth Weight: 9 lb 8 oz (4310 g) APGAR: 1, 5  Newborn Delivery   Birth date/time:  02/27/2018 01:22:00 Delivery type:  Vaginal, Spontaneous     Baby Feeding: Breast Disposition:home with mother   03/01/2018 Catalina Antigua, MD

## 2018-03-01 NOTE — Discharge Instructions (Signed)
Postpartum Care After Vaginal Delivery °The period of time right after you deliver your newborn is called the postpartum period. °What kind of medical care will I receive? °· You may continue to receive fluids and medicines through an IV tube inserted into one of your veins. °· If an incision was made near your vagina (episiotomy) or if you had some vaginal tearing during delivery, cold compresses may be placed on your episiotomy or your tear. This helps to reduce pain and swelling. °· You may be given a squirt bottle to use when you go to the bathroom. You may use this until you are comfortable wiping as usual. To use the squirt bottle, follow these steps: °? Before you urinate, fill the squirt bottle with warm water. Do not use hot water. °? After you urinate, while you are sitting on the toilet, use the squirt bottle to rinse the area around your urethra and vaginal opening. This rinses away any urine and blood. °? You may do this instead of wiping. As you start healing, you may use the squirt bottle before wiping yourself. Make sure to wipe gently. °? Fill the squirt bottle with clean water every time you use the bathroom. °· You will be given sanitary pads to wear. °How can I expect to feel? °· You may not feel the need to urinate for several hours after delivery. °· You will have some soreness and pain in your abdomen and vagina. °· If you are breastfeeding, you may have uterine contractions every time you breastfeed for up to several weeks postpartum. Uterine contractions help your uterus return to its normal size. °· It is normal to have vaginal bleeding (lochia) after delivery. The amount and appearance of lochia is often similar to a menstrual period in the first week after delivery. It will gradually decrease over the next few weeks to a dry, yellow-brown discharge. For most women, lochia stops completely by 6-8 weeks after delivery. Vaginal bleeding can vary from woman to woman. °· Within the first few  days after delivery, you may have breast engorgement. This is when your breasts feel heavy, full, and uncomfortable. Your breasts may also throb and feel hard, tightly stretched, warm, and tender. After this occurs, you may have milk leaking from your breasts. Your health care provider can help you relieve discomfort due to breast engorgement. Breast engorgement should go away within a few days. °· You may feel more sad or worried than normal due to hormonal changes after delivery. These feelings should not last more than a few days. If these feelings do not go away after several days, speak with your health care provider. °How should I care for myself? °· Tell your health care provider if you have pain or discomfort. °· Drink enough water to keep your urine clear or pale yellow. °· Wash your hands thoroughly with soap and water for at least 20 seconds after changing your sanitary pads, after using the toilet, and before holding or feeding your baby. °· If you are not breastfeeding, avoid touching your breasts a lot. Doing this can make your breasts produce more milk. °· If you become weak or lightheaded, or you feel like you might faint, ask for help before: °? Getting out of bed. °? Showering. °· Change your sanitary pads frequently. Watch for any changes in your flow, such as a sudden increase in volume, a change in color, the passing of large blood clots. If you pass a blood clot from your vagina, save it   to show to your health care provider. Do not flush blood clots down the toilet without having your health care provider look at them. °· Make sure that all your vaccinations are up to date. This can help protect you and your baby from getting certain diseases. You may need to have immunizations done before you leave the hospital. °· If desired, talk with your health care provider about methods of family planning or birth control (contraception). °How can I start bonding with my baby? °Spending as much time as  possible with your baby is very important. During this time, you and your baby can get to know each other and develop a bond. Having your baby stay with you in your room (rooming in) can give you time to get to know your baby. Rooming in can also help you become comfortable caring for your baby. Breastfeeding can also help you bond with your baby. °How can I plan for returning home with my baby? °· Make sure that you have a car seat installed in your vehicle. °? Your car seat should be checked by a certified car seat installer to make sure that it is installed safely. °? Make sure that your baby fits into the car seat safely. °· Ask your health care provider any questions you have about caring for yourself or your baby. Make sure that you are able to contact your health care provider with any questions after leaving the hospital. °This information is not intended to replace advice given to you by your health care provider. Make sure you discuss any questions you have with your health care provider. °Document Released: 07/14/2007 Document Revised: 02/19/2016 Document Reviewed: 08/21/2015 °Elsevier Interactive Patient Education © 2018 [employer] ° °

## 2018-03-01 NOTE — Lactation Note (Addendum)
This note was copied from a baby's chart. Lactation Consultation Note  Patient Name: Boy Gifford ShaveZari Hulgan BJYNW'GToday's Date: 03/01/2018 Reason for consult: Follow-up assessment;Early term 37-38.6wks;Primapara;1st time breastfeeding;Infant weight loss  5758 hours old female who is now being mostly breastfed and partially formula fed by his mother. Baby started formula supplementation last night with Gerber Gentle, and per mom BF is going much better now. Baby is finally taking the NS # 16 without any issues, baby was already nursing when entering the room.  Reviewed discharge instructions with both parents and red flags on when to call baby's pediatrician. Engorgement prevention and treatment was also discussed, mom already knew the subject since she's had previous losses and had to cut back her milk supply before.  Formula supplementation was also discussed, mom really wants to BF, praised her for efforts but made her aware that baby is at 9% weight loss and he'll still need to get supplemented until his next Dr appt where they can re-calibrate the amount or just dismiss it once her milk comes in. The plan for the next 48 hours until baby's next Dr. Alfonzo BeersAppt (mom still hasn't set it up or pick baby's pediatrician) will be to supplement baby with her breastmilk at 15-25 ml per feeding. If she's not able to pump that amount, she'll use Gerber Gentle to complete the amount required per feeding.  Mom doesn't have a pump at home; [facility], stating that [facility] and she'll be able to get a pump from them tomorrow. She'll use her hand pump in the mean time. [facility], but so far parents reported no further questions. Both are aware of LC OP services and will contact PRN.  Maternal Data    Feeding Feeding Type: Breast Fed Length of feed: 25 min  LATCH Score Latch: Grasps breast easily, tongue down, lips flanged,  rhythmical sucking.(with NS # 16)  Audible Swallowing: A few with stimulation  Type of Nipple: Everted at rest and after stimulation  Comfort (Breast/Nipple): Soft / non-tender  Hold (Positioning): No assistance needed to correctly position infant at breast.  LATCH Score: 9  Interventions Interventions: Breast feeding basics reviewed;Skin to skin;Breast massage;Breast compression  Lactation Tools Discussed/Used     Consult Status Consult Status: Complete Date: 03/01/18 Follow-up type: Call as needed    Aedan Geimer Venetia ConstableS Marua Qin 03/01/2018, 12:07 PM

## 2018-03-02 ENCOUNTER — Telehealth: Payer: Self-pay

## 2018-03-02 ENCOUNTER — Inpatient Hospital Stay (HOSPITAL_COMMUNITY)
Admission: AD | Admit: 2018-03-02 | Discharge: 2018-03-02 | Disposition: A | Discharge status: Home / Self Care | Payer: Medicaid Other | Source: Ambulatory Visit | Attending: Obstetrics & Gynecology | Admitting: Obstetrics & Gynecology

## 2018-03-02 ENCOUNTER — Encounter (HOSPITAL_COMMUNITY): Payer: Self-pay

## 2018-03-02 DIAGNOSIS — Z9889 Other specified postprocedural states: Secondary | ICD-10-CM | POA: Diagnosis not present

## 2018-03-02 DIAGNOSIS — Z87891 Personal history of nicotine dependence: Secondary | ICD-10-CM | POA: Insufficient documentation

## 2018-03-02 DIAGNOSIS — O165 Unspecified maternal hypertension, complicating the puerperium: Secondary | ICD-10-CM | POA: Diagnosis not present

## 2018-03-02 DIAGNOSIS — Z8759 Personal history of other complications of pregnancy, childbirth and the puerperium: Secondary | ICD-10-CM | POA: Diagnosis not present

## 2018-03-02 DIAGNOSIS — R51 Headache: Secondary | ICD-10-CM | POA: Diagnosis present

## 2018-03-02 DIAGNOSIS — G4440 Drug-induced headache, not elsewhere classified, not intractable: Secondary | ICD-10-CM

## 2018-03-02 LAB — COMPREHENSIVE METABOLIC PANEL
ALT: 17 U/L (ref 14–54)
AST: 21 U/L (ref 15–41)
Albumin: 3 g/dL — ABNORMAL LOW (ref 3.5–5.0)
Alkaline Phosphatase: 95 U/L (ref 38–126)
Anion gap: 10 (ref 5–15)
BILIRUBIN TOTAL: 0.3 mg/dL (ref 0.3–1.2)
BUN: 10 mg/dL (ref 6–20)
CALCIUM: 9.3 mg/dL (ref 8.9–10.3)
CHLORIDE: 101 mmol/L (ref 101–111)
CO2: 27 mmol/L (ref 22–32)
CREATININE: 0.71 mg/dL (ref 0.44–1.00)
Glucose, Bld: 87 mg/dL (ref 65–99)
Potassium: 4.5 mmol/L (ref 3.5–5.1)
Sodium: 138 mmol/L (ref 135–145)
TOTAL PROTEIN: 6.6 g/dL (ref 6.5–8.1)

## 2018-03-02 LAB — CBC
HEMATOCRIT: 34.9 % — AB (ref 36.0–46.0)
Hemoglobin: 11.2 g/dL — ABNORMAL LOW (ref 12.0–15.0)
MCH: 28.1 pg (ref 26.0–34.0)
MCHC: 32.1 g/dL (ref 30.0–36.0)
MCV: 87.5 fL (ref 78.0–100.0)
PLATELETS: 262 10*3/uL (ref 150–400)
RBC: 3.99 MIL/uL (ref 3.87–5.11)
RDW: 14 % (ref 11.5–15.5)
WBC: 6.5 10*3/uL (ref 4.0–10.5)

## 2018-03-02 LAB — PROTEIN / CREATININE RATIO, URINE
CREATININE, URINE: 64 mg/dL
Protein Creatinine Ratio: 0.2 mg/mg{Cre} — ABNORMAL HIGH (ref 0.00–0.15)
TOTAL PROTEIN, URINE: 13 mg/dL

## 2018-03-02 MED ORDER — AMLODIPINE BESYLATE 10 MG PO TABS: 10.0000 mg | ORAL_TABLET | Freq: Every day | ORAL | 0 refills | Status: DC

## 2018-03-02 MED ORDER — CYCLOBENZAPRINE HCL 10 MG PO TABS
10.0000 mg | ORAL_TABLET | Freq: Once | ORAL | Status: AC
  Administered 2018-03-02: 10 mg via ORAL
  Filled 2018-03-02: qty 1

## 2018-03-02 MED ORDER — CYCLOBENZAPRINE HCL 5 MG PO TABS: 5.0000 mg | ORAL_TABLET | Freq: Three times a day (TID) | ORAL | 0 refills | Status: DC | PRN

## 2018-03-02 MED ORDER — ACETAMINOPHEN 500 MG PO TABS
1000.0000 mg | ORAL_TABLET | Freq: Once | ORAL | Status: AC
  Administered 2018-03-02: 1000 mg via ORAL
  Filled 2018-03-02: qty 2

## 2018-03-02 NOTE — Telephone Encounter (Signed)
Patient called complaining of headache and and sharp pain right sided that has been ongoing today.  Patient is three days post delivery and is currently on procardia 30mg . [facility] Patient states understanding. Armandina StammerJennifer Chari Parmenter RN

## 2018-03-02 NOTE — MAU Note (Signed)
Vaginal delivery on 5/31, has had "horrible HA" all day today, has pain in rib cage under arm on R side, has been nauseated.  Had elevated BP's after delivery, on med.  States bleeding is normal.

## 2018-03-02 NOTE — MAU Provider Note (Signed)
History     CSN: [id number]  Arrival date and time: 03/02/18 1648   First Provider Initiated Contact with Patient 03/02/18 1736      Chief Complaint  Patient presents with  . Headache  . Leg Swelling  . Nausea   HPI Melanie Harmon is a 27 y.o. [phone] female who presents with headache, rib pain, nausea, and swelling. Patient is 3 days s/p SVD. Was diagnosed with intrapartum GHTN and discharged home yesterday on procardia.  Reports headache all day today. Has taken ibuprofen without relief. Describes as a "really bad" headache that she rates 5/10. Started in the back of her head and neck and now feels like a band around her head. Nothing makes better or worse. No visual disturbance. Endorses some nausea today; no vomiting.  Some right flank/rib pain that is worse with standing and walking. Denies CP or SOB.   OB History    Gravida  5   Para  2   Term  1   Preterm  1   AB  3   Living  1     SAB  2   TAB  1   Ectopic      Multiple  0   Live Births  2           Past Medical History:  Diagnosis Date  . Fetal demise before 22 weeks with retention of dead fetus    x 2 - one at 18 wks 01/2016, one at 22 wks 11/2016  . GERD (gastroesophageal reflux disease)    with pregnancy only  . Headache    with pregnancy only - otc tylenol med prn  . Missed abortion    x 2 - TAB and MAB (D&C)    Past Surgical History:  Procedure Laterality Date  . CERVICAL CERCLAGE N/A 09/04/2017   Procedure: CERCLAGE CERVICAL;  Surgeon: Willodean Rosenthal, MD;  Location: [facility];  Service: Gynecology;  Laterality: N/A;  . DILATION AND CURETTAGE OF UTERUS     MAB  . Dilation and Curretage    . WISDOM TOOTH EXTRACTION      Family History  Problem Relation Age of Onset  . Heart attack Maternal Grandmother   . Heart attack Maternal Grandfather     Social History   Tobacco Use  . Smoking status: Former Smoker    Packs/day: 1.00    Years: 7.00    Pack years: 7.00    Types:  Cigarettes    Last attempt to quit: 07/31/2017    Years since quitting: 0.5  . Smokeless tobacco: Never Used  Substance Use Topics  . Alcohol use: No  . Drug use: No    Allergies: No Known Allergies  Medications Prior to Admission  Medication Sig Dispense Refill Last Dose  . ibuprofen (ADVIL,MOTRIN) 600 MG tablet Take 1 tablet (600 mg total) by mouth every 6 (six) hours. 30 tablet 0   . NIFEdipine (PROCARDIA-XL/ADALAT CC) 30 MG 24 hr tablet Take 1 tablet (30 mg total) by mouth 2 (two) times daily. 60 tablet 0   . Prenatal Vit-Fe Fumarate-FA (MULTIVITAMIN-PRENATAL) 27-0.8 MG TABS tablet Take 1 tablet by mouth daily at 12 noon.   Past Week at Unknown time    Review of Systems  Constitutional: Negative.   Eyes: Negative for visual disturbance.  Respiratory: Negative.   Cardiovascular: Negative.   Gastrointestinal: Positive for nausea. Negative for abdominal pain and vomiting.  Genitourinary: Positive for flank pain and vaginal bleeding. Negative for dysuria and  hematuria.  Neurological: Positive for headaches. Negative for syncope.   Physical Exam   Blood pressure (!) 138/92, pulse 92, temperature 98.2 F (36.8 C), temperature source Oral, resp. rate 18, height 5\' 8"  (1.727 m), weight (!) 328 lb (148.8 kg), unknown if currently breastfeeding. Patient Vitals for the past 24 hrs:  BP Temp Temp src Pulse Resp Height Weight  03/02/18 1816 (!) 138/92 - - 92 - - -  03/02/18 1801 132/82 - - (!) 103 - - -  03/02/18 1746 128/73 - - (!) 109 - - -  03/02/18 1731 124/73 - - (!) 104 - - -  03/02/18 1729 121/71 - - (!) 105 - - -  03/02/18 1706 136/85 98.2 F (36.8 C) Oral (!) 102 18 5\' 8"  (1.727 m) (!) 328 lb (148.8 kg)    Physical Exam  Nursing note and vitals reviewed. Constitutional: She is oriented to person, place, and time. She appears well-developed and well-nourished. No distress.  HENT:  Head: Normocephalic and atraumatic.  Eyes: Conjunctivae are normal. Right eye exhibits no  discharge. Left eye exhibits no discharge. No scleral icterus.  Neck: Normal range of motion.  Cardiovascular: Normal rate, regular rhythm and normal heart sounds.  No murmur heard. Respiratory: Effort normal and breath sounds normal. No respiratory distress. She has no wheezes.  GI: Soft. There is no tenderness. There is no rigidity, no guarding and no CVA tenderness.  Musculoskeletal: She exhibits edema (BLE, 2 + pitting).  Neurological: She is alert and oriented to person, place, and time. She has normal reflexes.  No clonus  Skin: Skin is warm and dry. She is not diaphoretic.  Psychiatric: She has a normal mood and affect. Her behavior is normal. Judgment and thought content normal.    MAU Course  Procedures Results for orders placed or performed during the hospital encounter of 03/02/18 (from the past 24 hour(s))  Protein / creatinine ratio, urine     Status: Abnormal   Collection Time: 03/02/18  5:10 PM  Result Value Ref Range   Creatinine, Urine 64.00 mg/dL   Total Protein, Urine 13 mg/dL   Protein Creatinine Ratio 0.20 (H) 0.00 - 0.15 mg/mg[Cre]  CBC     Status: Abnormal   Collection Time: 03/02/18  5:30 PM  Result Value Ref Range   WBC 6.5 4.0 - 10.5 K/uL   RBC 3.99 3.87 - 5.11 MIL/uL   Hemoglobin 11.2 (L) 12.0 - 15.0 g/dL   HCT 16.1 (L) 09.6 - 04.5 %   MCV 87.5 78.0 - 100.0 fL   MCH 28.1 26.0 - 34.0 pg   MCHC 32.1 30.0 - 36.0 g/dL   RDW 40.9 81.1 - 91.4 %   Platelets 262 150 - 400 K/uL  Comprehensive metabolic panel     Status: Abnormal   Collection Time: 03/02/18  5:30 PM  Result Value Ref Range   Sodium 138 135 - 145 mmol/L   Potassium 4.5 3.5 - 5.1 mmol/L   Chloride 101 101 - 111 mmol/L   CO2 27 22 - 32 mmol/L   Glucose, Bld 87 65 - 99 mg/dL   BUN 10 6 - 20 mg/dL   Creatinine, Ser 7.82 0.44 - 1.00 mg/dL   Calcium 9.3 8.9 - 95.6 mg/dL   Total Protein 6.6 6.5 - 8.1 g/dL   Albumin 3.0 (L) 3.5 - 5.0 g/dL   AST 21 15 - 41 U/L   ALT 17 14 - 54 U/L   Alkaline  Phosphatase 95 38 - 126  U/L   Total Bilirubin 0.3 0.3 - 1.2 mg/dL   GFR calc non Af Amer >60 >60 mL/min   GFR calc Af Amer >60 >60 mL/min   Anion gap 10 5 - 15    MDM Normotensive with 1 mildly elevated BP. PEC labs WNL Tylenol & flexeril given --- headache down to 2/10  Assessment and Plan  A: 1. Postpartum hypertension   2. Drug-induced headache, not elsewhere classified, not intractable    P: Discharge home D/c procardia d/t headache Rx norvasc Rx flexeril #10 Keep f/u for BP check Discussed reasons to return to MAU  Judeth HornErin Holston Oyama 03/02/2018, 5:36 PM

## 2018-03-02 NOTE — Discharge Instructions (Signed)
Postpartum Hypertension °Postpartum hypertension is high blood pressure after pregnancy that remains higher than normal for more than two days after delivery. You may not realize that you have postpartum hypertension if your blood pressure is not being checked regularly. In some cases, postpartum hypertension will go away on its own, usually within a week of delivery. However, for some women, medical treatment is required to prevent serious complications, such as seizures or stroke. °The following things can affect your blood pressure: °· The type of delivery you had. °· Having received IV fluids or other medicines during or after delivery. ° °What are the causes? °Postpartum hypertension may be caused by any of the following or by a combination of any of the following: °· Hypertension that existed before pregnancy (chronic hypertension). °· Gestational hypertension. °· Preeclampsia or eclampsia. °· Receiving a lot of fluid through an IV during or after delivery. °· Medicines. °· HELLP syndrome. °· Hyperthyroidism. °· Stroke. °· Other rare neurological or blood disorders. ° °In some cases, the cause may not be known. °What increases the risk? °Postpartum hypertension can be related to one or more risk factors, such as: °· Chronic hypertension. In some cases, this may not have been diagnosed before pregnancy. °· Obesity. °· Type 2 diabetes. °· Kidney disease. °· Family history of preeclampsia. °· Other medical conditions that cause hormonal imbalances. ° °What are the signs or symptoms? °As with all types of hypertension, postpartum hypertension may not have any symptoms. Depending on how high your blood pressure is, you may experience: °· Headaches. These may be mild, moderate, or severe. They may also be steady, constant, or sudden in onset (thunderclap headache). °· Visual changes. °· Dizziness. °· Shortness of breath. °· Swelling of your hands, feet, lower legs, or face. In some cases, you may have swelling in  more than one of these locations. °· Heart palpitations or a racing heartbeat. °· Difficulty breathing while lying down. °· Decreased urination. ° °Other rare signs and symptoms may include: °· Sweating more than usual. This lasts longer than a few days after delivery. °· Chest pain. °· Sudden dizziness when you get up from sitting or lying down. °· Seizures. °· Nausea or vomiting. °· Abdominal pain. ° °How is this diagnosed? °The diagnosis of postpartum hypertension is made through a combination of physical examination findings and testing of your blood and urine. You may also have additional tests, such as a CT scan or an MRI, to check for other complications of postpartum hypertension. °How is this treated? °When blood pressure is high enough to require treatment, your options may include: °· Medicines to reduce blood pressure (antihypertensives). Tell your health care provider if you are breastfeeding or if you plan to breastfeed. There are many antihypertensive medicines that are safe to take while breastfeeding. °· Stopping medicines that may be causing hypertension. °· Treating medical conditions that are causing hypertension. °· Treating the complications of hypertension, such as seizures, stroke, or kidney problems. ° °Your health care provider will also continue to monitor your blood pressure closely and repeatedly until it is within a safe range for you. °Follow these instructions at home: °· Take medicines only as directed by your health care provider. °· Get regular exercise after your health care provider tells you that it is safe. °· Follow your health care provider’s recommendations on fluid and salt restrictions. °· Do not use any tobacco products, including cigarettes, chewing tobacco, or electronic cigarettes. If you need help quitting, ask your health care provider. °·   Keep all follow-up visits as directed by your health care provider. This is important. Contact a health care provider  if:  Your symptoms get worse.  You have new symptoms, such as: ? Headache. ? Dizziness. ? Visual changes. Get help right away if:  You develop a severe or sudden headache.  You have seizures.  You develop numbness or weakness on one side of your body.  You have difficulty thinking, speaking, or swallowing.  You develop severe abdominal pain.  You develop difficulty breathing, chest pain, a racing heartbeat, or heart palpitations. These symptoms may represent a serious problem that is an emergency. Do not wait to see if the symptoms will go away. Get medical help right away. Call your local emergency services (911 in the [place] Do not drive yourself to the hospital. This information is not intended to replace advice given to you by your health care provider. Make sure you discuss any questions you have with your health care provider. Document Released: 05/20/2014 Document Revised: 02/19/2016 Document Reviewed: 03/31/2014 Elsevier Interactive Patient Education  2018 Else[employer] Inc[employer]      General Headache Without Cause A headache is pain or discomfort felt around the head or neck area. There are many causes and types of headaches. In some cases, the cause may not be found. Follow these instructions at home: Managing pain  Take over-the-counter and prescription medicines only as told by your doctor.  Lie down in a dark, quiet room when you have a headache.  If directed, apply ice to the head and neck area: ? Put ice in a plastic bag. ? Place a towel between your skin and the bag. ? Leave the ice on for 20 minutes, 2-3 times per day.  Use a heating pad or hot shower to apply heat to the head and neck area as told by your doctor.  Keep lights dim if bright lights bother you or make your headaches worse. Eating and drinking  Eat meals on a regular schedule.  Lessen how much alcohol you drink.  Lessen how much caffeine you drink, or stop drinking caffeine. General  instructions  Keep all follow-up visits as told by your doctor. This is important.  Keep a journal to find out if certain things bring on headaches. For example, write down: ? What you eat and drink. ? How much sleep you get. ? Any change to your diet or medicines.  Relax by getting a massage or doing other relaxing activities.  Lessen stress.  Sit up straight. Do not tighten (tense) your muscles.  Do not use tobacco products. This includes cigarettes, chewing tobacco, or e-cigarettes. If you need help quitting, ask your doctor.  Exercise regularly as told by your doctor.  Get enough sleep. This often means 7-9 hours of sleep. Contact a doctor if:  Your symptoms are not helped by medicine.  You have a headache that feels different than the other headaches.  You feel sick to your stomach (nauseous) or you throw up (vomit).  You have a fever. Get help right away if:  Your headache becomes really bad.  You keep throwing up.  You have a stiff neck.  You have trouble seeing.  You have trouble speaking.  You have pain in the eye or ear.  Your muscles are weak or you lose muscle control.  You lose your balance or have trouble walking.  You feel like you will pass out (faint) or you pass out.  You have confusion. This information is not  intended to replace advice given to you by your health care provider. Make sure you discuss any questions you have with your health care provider. Document Released: 06/25/2008 Document Revised: 02/22/2016 Document Reviewed: 01/09/2015 Elsevier Interactive Patient Education  [employer]2018 Else[employer] Inc.

## 2018-03-03 ENCOUNTER — Encounter: Payer: Medicaid Other | Admitting: Advanced Practice Midwife

## 2018-03-03 ENCOUNTER — Other Ambulatory Visit: Payer: Medicaid Other

## 2018-03-03 VITALS — BP 131/67 | HR 94 | Wt 328.0 lb

## 2018-03-03 DIAGNOSIS — Z0130 Encounter for examination of blood pressure without abnormal findings: Secondary | ICD-10-CM

## 2018-03-03 NOTE — Progress Notes (Signed)
Chart reviewed - agree with RN documentation.   

## 2018-03-03 NOTE — Progress Notes (Signed)
Patient seen for bp check today. Patient received new blood pressure medication yesterday in [facility]. She is now on amlodipine 10 mg daily. Patient encouraged to keep taking BP medication and to call with any new/worsening symptoms. Patient states understanding. Has postpartum visit scheduled and plans to keep. Melanie StammerJennifer Fayth Trefry RN

## 2018-03-04 ENCOUNTER — Encounter (HOSPITAL_COMMUNITY): Payer: Medicaid Other

## 2018-03-06 ENCOUNTER — Other Ambulatory Visit: Payer: Medicaid Other

## 2018-03-13 NOTE — Telephone Encounter (Signed)
Preadmission screen  

## 2018-04-09 ENCOUNTER — Encounter: Payer: Self-pay | Admitting: Family Medicine

## 2018-04-09 ENCOUNTER — Ambulatory Visit (INDEPENDENT_AMBULATORY_CARE_PROVIDER_SITE_OTHER): Payer: Medicaid Other | Admitting: Family Medicine

## 2018-04-09 DIAGNOSIS — Z1389 Encounter for screening for other disorder: Secondary | ICD-10-CM

## 2018-04-09 DIAGNOSIS — Z3042 Encounter for surveillance of injectable contraceptive: Secondary | ICD-10-CM | POA: Diagnosis not present

## 2018-04-09 MED ORDER — MEDROXYPROGESTERONE ACETATE 150 MG/ML IM SUSP
150.0000 mg | INTRAMUSCULAR | Status: AC
  Administered 2018-04-09 – 2018-10-12 (×2): 150 mg via INTRAMUSCULAR

## 2018-04-09 NOTE — Progress Notes (Signed)
Subjective:     Melanie Harmon is a 27 y.o. female who presents for a postpartum visit. She is 6 weeks postpartum following a spontaneous vaginal delivery. I have fully reviewed the prenatal and intrapartum course. The delivery was at 38 gestational weeks. Outcome: spontaneous vaginal delivery. Anesthesia: epidural. Postpartum course has been uneventful . Baby's course has been uneventful. Baby is feeding by breast. Bleeding thin lochia. Bowel function is normal. Bladder function is normal. Patient is not sexually active. Contraception method is Depo-Provera injections. Postpartum depression screening: negative (score 0)  The following portions of the patient's history were reviewed and updated as appropriate: allergies, current medications, past family history, past medical history, past social history, past surgical history and problem list.  Review of Systems Pertinent items noted in HPI and remainder of comprehensive ROS otherwise negative.   Objective:    BP (!) 106/53 (BP Location: Left Arm)   Pulse 93   Wt (!) 306 lb (138.8 kg)   BMI 46.53 kg/m   General:  alert, cooperative and no distress  Lungs: clear to auscultation bilaterally  Heart:  regular rate and rhythm, S1, S2 normal, no murmur, click, rub or gallop  Abdomen: soft, non-tender; bowel sounds normal; no masses,  no organomegaly        Assessment:     Normal postpartum exam. Pap smear not done at today's visit. Last pap 01-24-16 negative. Next pap smear due 01-25-2019.  Plan:    1. Contraception: Depo-Provera injections 2. Follow up in: 3 months or as needed.

## 2018-04-09 NOTE — Addendum Note (Signed)
Addended by: Anell BarrHOWARD, Caydance Kuehnle L on: 04/09/2018 11:07 AM   Modules accepted: Orders

## 2018-06-08 ENCOUNTER — Ambulatory Visit: Payer: Medicaid Other

## 2018-07-08 ENCOUNTER — Ambulatory Visit: Payer: Medicaid Other

## 2018-07-09 ENCOUNTER — Ambulatory Visit: Payer: Medicaid Other | Admitting: Family Medicine

## 2018-07-13 ENCOUNTER — Ambulatory Visit (INDEPENDENT_AMBULATORY_CARE_PROVIDER_SITE_OTHER): Payer: Medicaid Other

## 2018-07-13 VITALS — BP 118/72 | HR 80 | Resp 16 | Ht 68.0 in | Wt 312.0 lb

## 2018-07-13 DIAGNOSIS — Z3202 Encounter for pregnancy test, result negative: Secondary | ICD-10-CM

## 2018-07-13 DIAGNOSIS — Z3042 Encounter for surveillance of injectable contraceptive: Secondary | ICD-10-CM | POA: Diagnosis not present

## 2018-07-13 DIAGNOSIS — Z01818 Encounter for other preprocedural examination: Secondary | ICD-10-CM

## 2018-07-13 LAB — POCT URINE PREGNANCY: Preg Test, Ur: NEGATIVE

## 2018-07-13 MED ORDER — MEDROXYPROGESTERONE ACETATE 150 MG/ML IM SUSP
150.0000 mg | Freq: Once | INTRAMUSCULAR | Status: AC
Start: 2018-07-13 — End: 2018-07-13
  Administered 2018-07-13: 150 mg via INTRAMUSCULAR

## 2018-07-13 MED ORDER — MEDROXYPROGESTERONE ACETATE 150 MG/ML IM SUSP: 150.0000 mg | INTRAMUSCULAR | 0 refills | Status: DC

## 2018-07-13 NOTE — Progress Notes (Addendum)
After obtaining consent, and per orders of Dr. Erin Fulling, injection of Depo Provera was given by Riley Nearing. Patient reports no complaints from last injection - 04/09/18 - LUOQ. Window:06/25/18 - 07/09/18.  Patient is 4 days out of window. Patient reports she has had unprotected sex - 07/11/18. Patient reports she is presently nursing. UPT - Negative.  Reviewed with provider  - approved for injection. Administered Depo Provera - RUOQ. Patient tolerated injection well with no complications.  Advised patient to schedule next Depo Provera injection between 09/28/18 - 10/12/2018. Advised patient to pick up prescription before next visit and bring to the office with her for injection to be administered.   Patient instructed to remain in clinic for 20 minutes afterwards, and to report any adverse reaction to me immediately.   Attestation of Attending Supervision of CMA/RN: Evaluation and management procedures were performed by the nurse under my supervision and collaboration.  I have reviewed the nursing note and chart, and I agree with the management and plan.  Carolyn L. Harraway-Smith, M.D., [facility]

## 2018-08-06 ENCOUNTER — Other Ambulatory Visit (HOSPITAL_COMMUNITY)
Admission: RE | Admit: 2018-08-06 | Discharge: 2018-08-06 | Disposition: A | Discharge status: Home / Self Care | Payer: Medicaid Other | Source: Ambulatory Visit | Attending: Family Medicine | Admitting: Family Medicine

## 2018-08-06 ENCOUNTER — Ambulatory Visit (INDEPENDENT_AMBULATORY_CARE_PROVIDER_SITE_OTHER): Payer: Medicaid Other | Admitting: Family Medicine

## 2018-08-06 ENCOUNTER — Encounter: Payer: Self-pay | Admitting: Family Medicine

## 2018-08-06 VITALS — BP 112/76 | HR 101 | Ht 68.0 in | Wt 310.0 lb

## 2018-08-06 DIAGNOSIS — Z23 Encounter for immunization: Secondary | ICD-10-CM

## 2018-08-06 DIAGNOSIS — Z01419 Encounter for gynecological examination (general) (routine) without abnormal findings: Secondary | ICD-10-CM | POA: Insufficient documentation

## 2018-08-06 DIAGNOSIS — Z0000 Encounter for general adult medical examination without abnormal findings: Secondary | ICD-10-CM

## 2018-08-06 NOTE — Progress Notes (Signed)
GYNECOLOGY ANNUAL PREVENTATIVE CARE ENCOUNTER NOTE  Subjective:   Melanie Harmon is a 27 y.o. [phone] female here for a routine annual gynecologic exam.  Current complaints: intermittent anger at random things. .   Denies abnormal vaginal bleeding, discharge, pelvic pain, problems with intercourse or other gynecologic concerns.    Gynecologic History No LMP recorded. (Menstrual status: Lactating). Patient is sexually active  Contraception: Depo-Provera injections Last Pap: 2017. Results were: normal Last mammogram: n/a.  Obstetric History OB History  Gravida Para Term Preterm AB Living  5 2 1 1 3 1   SAB TAB Ectopic Multiple Live Births  2 1   0 2    # Outcome Date GA Lbr Len/2nd Weight Sex Delivery Anes PTL Lv  5 Term 02/27/18 [redacted]w[redacted]d 21:20 / 00:42 9 lb 8 oz (4.31 kg) M Vag-Spont Local  LIV  4 Preterm 11/29/16 [redacted]w[redacted]d  14.1 oz (0.4 kg) F Vag-Spont None  ND  3 SAB  [redacted]w[redacted]d         2 SAB           1 TAB             Past Medical History:  Diagnosis Date  . Fetal demise before 22 weeks with retention of dead fetus    x 2 - one at 18 wks 01/2016, one at 22 wks 11/2016  . GERD (gastroesophageal reflux disease)    with pregnancy only  . Headache    with pregnancy only - otc tylenol med prn  . Missed abortion    x 2 - TAB and MAB (D&C)    Past Surgical History:  Procedure Laterality Date  . CERVICAL CERCLAGE N/A 09/04/2017   Procedure: CERCLAGE CERVICAL;  Surgeon: Willodean Rosenthal, MD;  Location: [facility];  Service: Gynecology;  Laterality: N/A;  . DILATION AND CURETTAGE OF UTERUS     MAB  . Dilation and Curretage    . WISDOM TOOTH EXTRACTION      Current Outpatient Medications on File Prior to Visit  Medication Sig Dispense Refill  . medroxyPROGESTERone (DEPO-PROVERA) 150 MG/ML injection Inject 1 mL (150 mg total) into the muscle every 3 (three) months. 1 mL 0  . pantoprazole (PROTONIX) 40 MG tablet Take 40 mg by mouth daily.  3  . Prenatal Vit-Fe Fumarate-FA  (MULTIVITAMIN-PRENATAL) 27-0.8 MG TABS tablet Take 1 tablet by mouth daily at 12 noon.    Marland Kitchen amLODipine (NORVASC) 10 MG tablet Take 1 tablet (10 mg total) by mouth daily. 30 tablet 0   Current Facility-Administered Medications on File Prior to Visit  Medication Dose Route Frequency Provider Last Rate Last Dose  . medroxyPROGESTERone (DEPO-PROVERA) injection 150 mg  150 mg Intramuscular Q90 days Levie Heritage, DO   150 mg at 04/09/18 1106    No Known Allergies  Social History   Socioeconomic History  . Marital status: Single    Spouse name: Not on file  . Number of children: Not on file  . Years of education: Not on file  . Highest education level: Not on file  Occupational History  . Not on file  Social Needs  . Financial resource strain: Not on file  . Food insecurity:    Worry: Not on file    Inability: Not on file  . Transportation needs:    Medical: Not on file    Non-medical: Not on file  Tobacco Use  . Smoking status: Former Smoker    Packs/day: 1.00    Years: 7.00  Pack years: 7.00    Types: Cigarettes    Last attempt to quit: 07/31/2017    Years since quitting: 1.0  . Smokeless tobacco: Never Used  Substance and Sexual Activity  . Alcohol use: No  . Drug use: No  . Sexual activity: Not Currently    Birth control/protection: None  Lifestyle  . Physical activity:    Days per week: Not on file    Minutes per session: Not on file  . Stress: Not on file  Relationships  . Social connections:    Talks on phone: Not on file    Gets together: Not on file    Attends religious service: Not on file    Active member of club or organization: Not on file    Attends meetings of clubs or organizations: Not on file    Relationship status: Not on file  . Intimate partner violence:    Fear of current or ex partner: Not on file    Emotionally abused: Not on file    Physically abused: Not on file    Forced sexual activity: Not on file  Other Topics Concern  . Not on  file  Social History Narrative  . Not on file    Family History  Problem Relation Age of Onset  . Heart attack Maternal Grandmother   . Heart attack Maternal Grandfather     The following portions of the patient's history were reviewed and updated as appropriate: allergies, current medications, past family history, past medical history, past social history, past surgical history and problem list.  Review of Systems Pertinent items are noted in HPI.   Objective:  BP 112/76   Pulse (!) 101   Ht 5\' 8"  (1.727 m)   Wt (!) 310 lb (140.6 kg)   BMI 47.14 kg/m  CONSTITUTIONAL: Well-developed, well-nourished female in no acute distress.  HENT:  Normocephalic, atraumatic, External right and left ear normal. Oropharynx is clear and moist EYES: Conjunctivae and EOM are normal. Pupils are equal, round, and reactive to light. No scleral icterus.  NECK: Normal range of motion, supple, no masses.  Normal thyroid.   CARDIOVASCULAR: Normal heart rate noted, regular rhythm RESPIRATORY: Clear to auscultation bilaterally. Effort and breath sounds normal, no problems with respiration noted. BREASTS: Symmetric in size. No masses, skin changes, nipple drainage, or lymphadenopathy. ABDOMEN: Soft, normal bowel sounds, no distention noted.  No tenderness, rebound or guarding.  PELVIC: Normal appearing external genitalia; normal appearing vaginal mucosa and cervix.  No abnormal discharge noted.  Normal uterine size, no other palpable masses, no uterine or adnexal tenderness. MUSCULOSKELETAL: Normal range of motion. No tenderness.  No cyanosis, clubbing, or edema.  2+ distal pulses. SKIN: Skin is warm and dry. No rash noted. Not diaphoretic. No erythema. No pallor. NEUROLOGIC: Alert and oriented to person, place, and time. Normal reflexes, muscle tone coordination. No cranial nerve deficit noted. PSYCHIATRIC: Normal mood and affect. Normal behavior. Normal judgment and thought content.  Assessment:  Annual  gynecologic examination with pap smear   Plan:  1. Well Woman Exam Will follow up results of pap smear and manage accordingly. STD testing discussed. Patient declined testing  Routine preventative health maintenance measures emphasized. Please refer to After Visit Summary for other counseling recommendations.    Candelaria Celeste, DO [facility]

## 2018-08-10 LAB — CYTOLOGY - PAP
Chlamydia: NEGATIVE
DIAGNOSIS: NEGATIVE
Neisseria Gonorrhea: NEGATIVE

## 2018-10-12 ENCOUNTER — Ambulatory Visit (INDEPENDENT_AMBULATORY_CARE_PROVIDER_SITE_OTHER): Payer: Medicaid Other

## 2018-10-12 VITALS — BP 105/61 | HR 95 | Ht 68.0 in | Wt 308.0 lb

## 2018-10-12 DIAGNOSIS — Z3042 Encounter for surveillance of injectable contraceptive: Secondary | ICD-10-CM

## 2018-10-12 NOTE — Progress Notes (Addendum)
Melanie Harmon here for Depo-Provera  Injection.  Injection administered without complication. Patient will return in 3 months for next injection.  chiquita l wilson, CMA 10/12/2018  1:28 PM  Attestation of Attending Supervision of RN: Evaluation and management procedures were performed by the nurse under my supervision and collaboration.  I have reviewed the nursing note and chart, and I agree with the management and plan.  Carolyn L. Harraway-Smith, M.D., [facility]

## 2018-10-25 IMAGING — US US MFM OB TRANSVAGINAL
1 series · 13 of 28 positions shown · non-contrast
Comparison: none

[Series 1: us mfm ob transvaginal · 66 acquisitions, 13 frames shown]
[im 3/66]
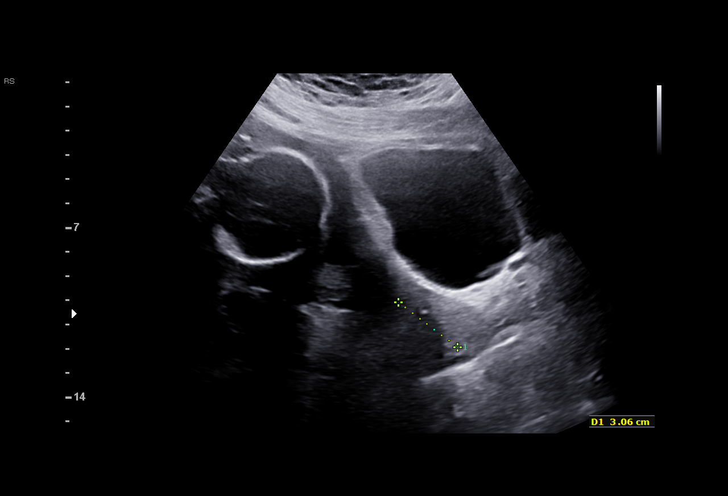
[im 8/66]
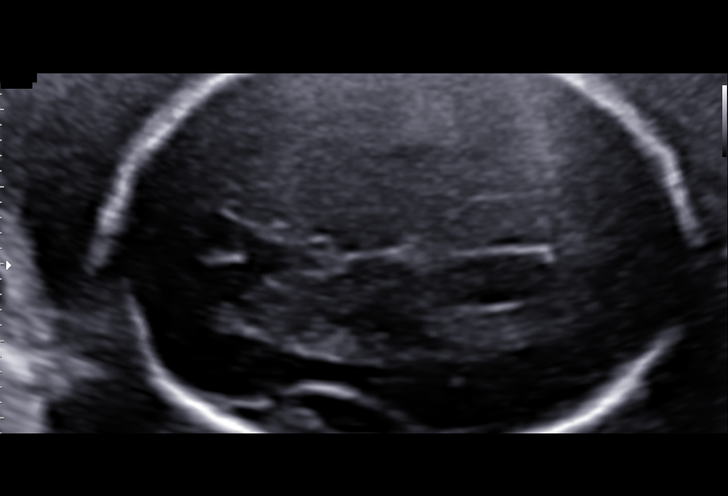
[im 13/66]
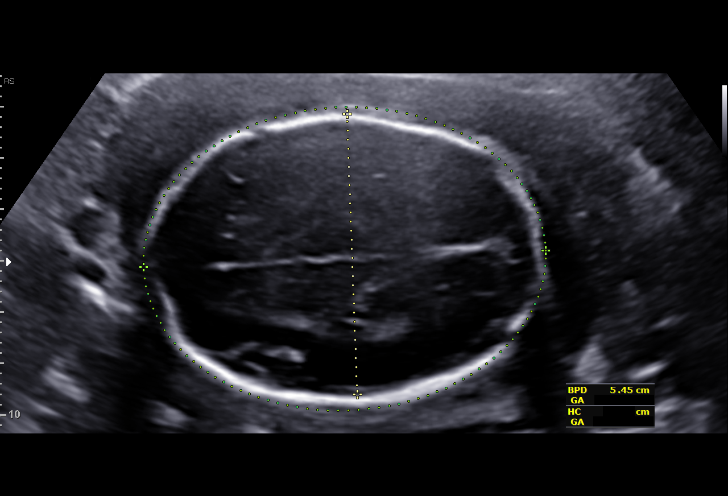
[im 17/66]
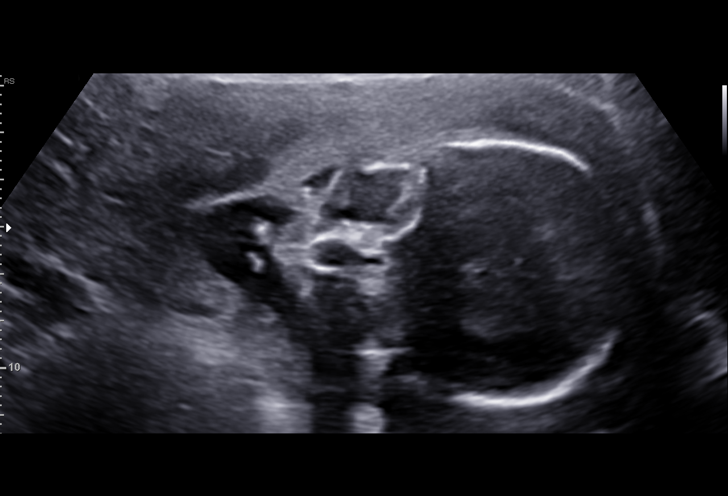
[im 22/66]
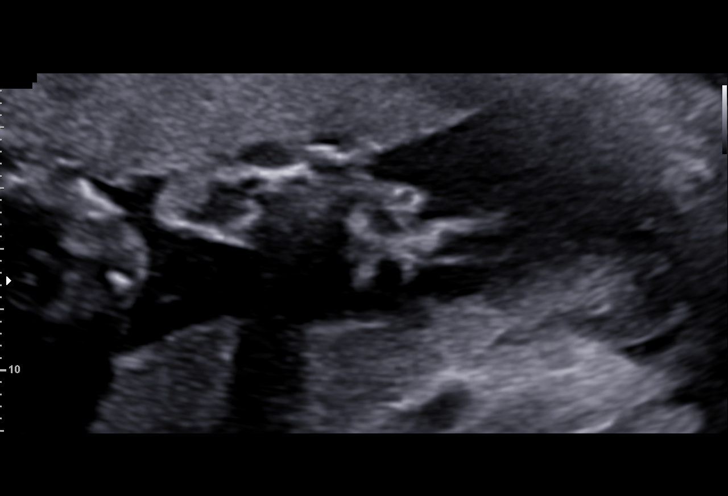
[im 27/66]
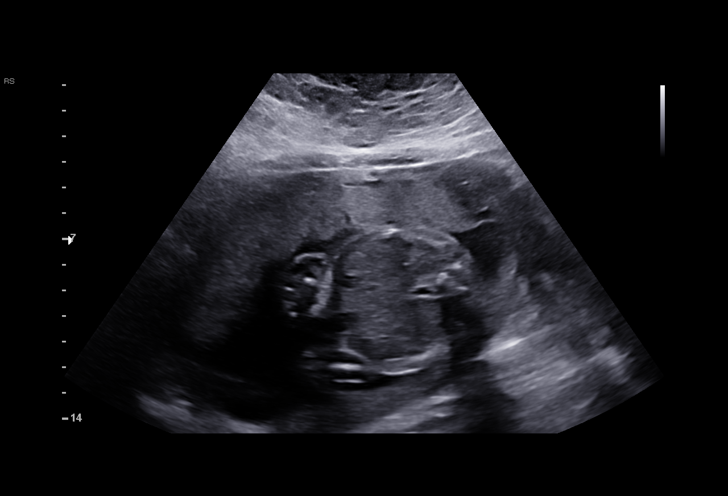
[im 34/66]
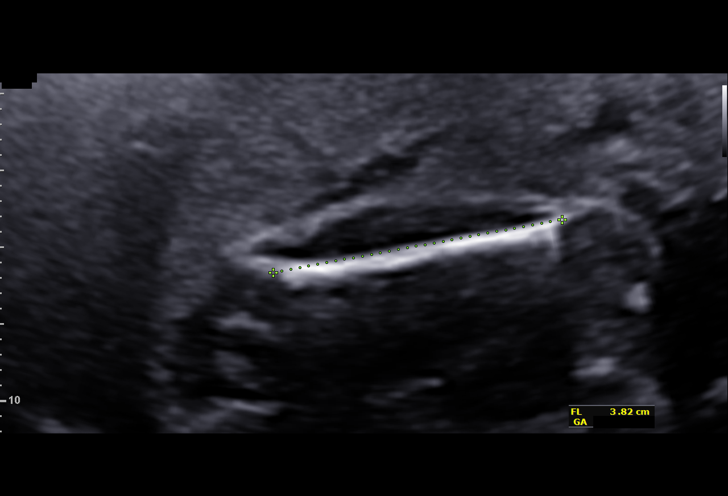
[im 39/66]
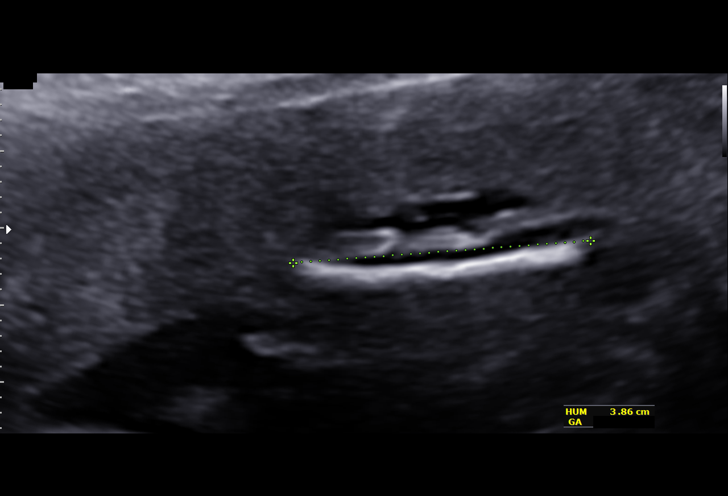
[im 44/66]
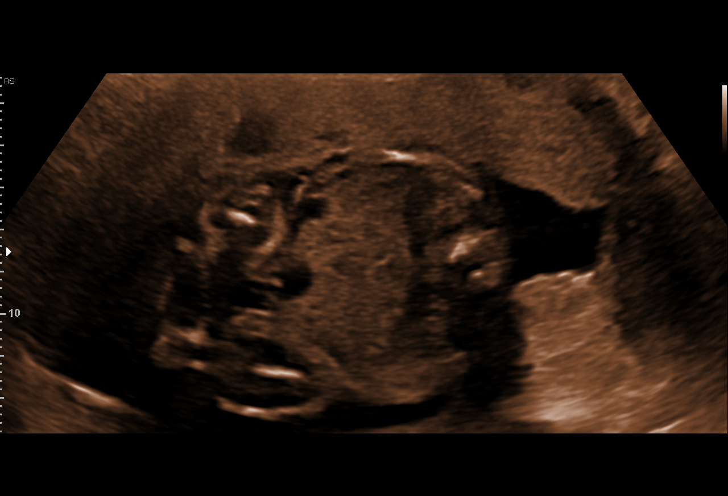
[im 49/66]
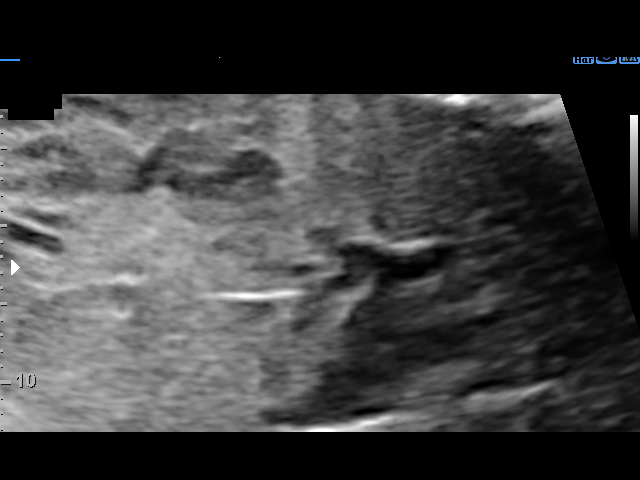
[im 53/66]
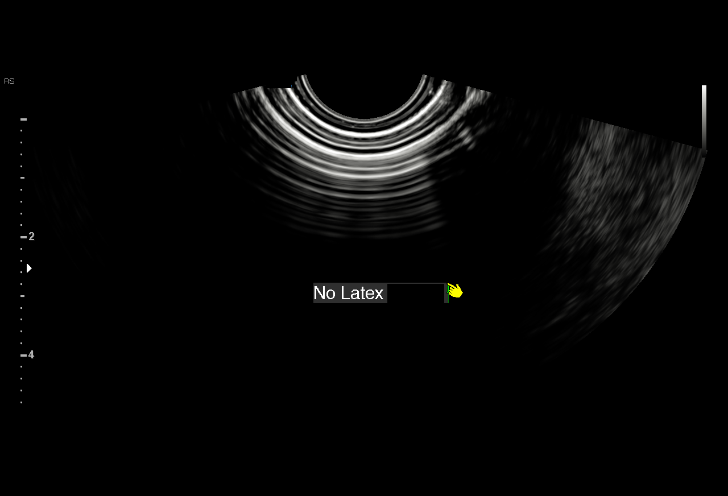
[im 58/66]
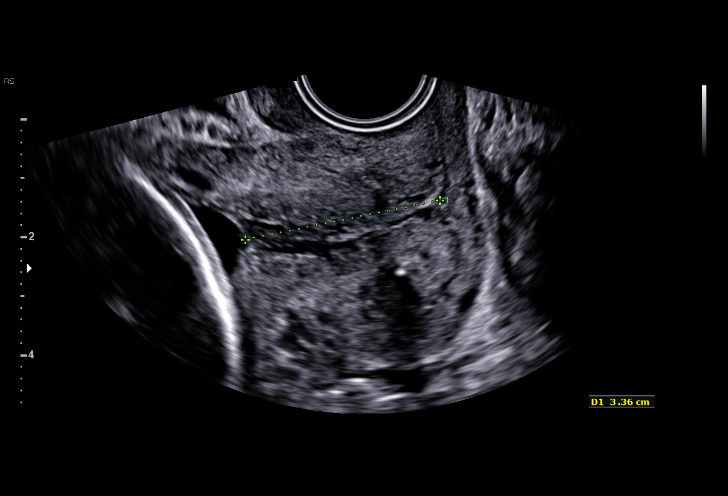
[im 63/66]
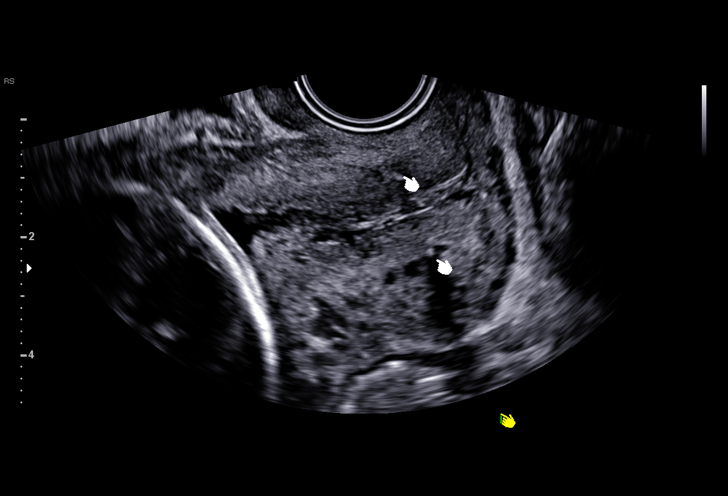

[13 of 28 positions shown; findings below may reference images not displayed]

for [REDACTED]care

1  TAKIYAH RING              [id number]      [id number]     [id number]
2  TAKIYAH RING              [id number]      [id number]     [id number]
Indications

23 weeks gestation of pregnancy
Encounter for other antenatal screening
follow-up
Encounter for cervical length
Maternal morbid obesity (pregravid BMI 46)
Cervical cerclage suture present, second
trimester (Placed 09-04-17)
Cervical incompetence, second trimester
Poor obstetric history: Previous midtrimester
loss (18 and 21 weeks)
OB History

Blood Type:            Height:  5'8"   Weight (lb):  308       BMI:
Gravidity:    5         Term:   0        Prem:   1        SAB:   2
TOP:          1       Ectopic:  0        Living: 0
Fetal Evaluation

Num Of Fetuses:     1
Fetal Heart         145
Rate(bpm):
Cardiac Activity:   Observed
Presentation:       Cephalic
Placenta:           Anterior, above cervical os
P. Cord Insertion:  Previously Visualized

Amniotic Fluid
AFI FV:      Subjectively within normal limits
Largest Pocket(cm)
5.02
Biometry

BPD:      54.2  mm     G. Age:  22w 3d         22  %    CI:        63.98   %    70 - 86
FL/HC:      18.5   %    19.2 -
HC:      218.1  mm     G. Age:  23w 6d         63  %    HC/AC:      1.17        1.05 -
AC:      187.1  mm     G. Age:  23w 4d         53  %    FL/BPD:     74.4   %    71 - 87
FL:       40.3  mm     G. Age:  23w 0d         35  %    FL/AC:      21.5   %    20 - 24
HUM:      37.6  mm     G. Age:  23w 1d         44  %

Est. FW:     581  gm      1 lb 4 oz     56  %
Gestational Age

LMP:           23w 1d        Date:  06/03/17                 EDD:   03/10/18
U/S Today:     23w 2d                                        EDD:   03/09/18
Best:          23w 1d     Det. By:  LMP  (06/03/17)          EDD:   03/10/18
Anatomy

Cranium:               Appears normal         Aortic Arch:            Not well visualized
Cavum:                 Not well visualized    Ductal Arch:            Not well visualized
Ventricles:            Appears normal         Diaphragm:              Previously seen
Choroid Plexus:        Previously seen        Stomach:                Appears normal, left
sided
Cerebellum:            Previously seen        Abdomen:                Appears normal
Posterior Fossa:       Previously seen        Abdominal Wall:         Previously seen
Nuchal Fold:           Not applicable (>20    Cord Vessels:           Previously seen
wks GA)
Face:                  Orbits nl; profile not Kidneys:                Appear normal
well visualized
Lips:                  Appears normal         Bladder:                Appears normal
Thoracic:              Appears normal         Spine:                  Previously seen
Heart:                 Not well visualized    Upper Extremities:      Previously seen
RVOT:                  Not well visualized    Lower Extremities:      Previously seen
LVOT:                  Appears normal

Other:  Parents do not wish to know sex of fetus. Heels previously visualized.
Technically difficult due to maternal habitus and fetal position.
Cervix Uterus Adnexa

Cervix
Length:           3.36  cm.
Measured transvaginally.

Uterus
No abnormality visualized.

Left Ovary
Within normal limits.

Right Ovary
Not visualized.

Adnexa:       No abnormality visualized. No adnexal mass
visualized.
Impression

Single living intrauterine pregnancy at 23w 1d.
Cephalic presentation.
Placenta Anterior, above cervical os.
Normal amniotic fluid volume.
Appropriate interval fetal growth.
Normal interval fetal anatomy.
limited views as above owing to insonating characteristics
cerclage is seen and is intact
Cervix is long and closed, measuring 3.36cm in closed
functional length
Recommendations

Repeat cervical length in 2 weeks via endovaginal ultrasound.

## 2018-11-23 IMAGING — US US MFM OB FOLLOW-UP
2 series · 13 of 28 positions shown · non-contrast
Comparison: none

[Series 1: us mfm ob follow-up · 31 acquisitions, 7 frames shown (1 of 2)]
[im 3/31]
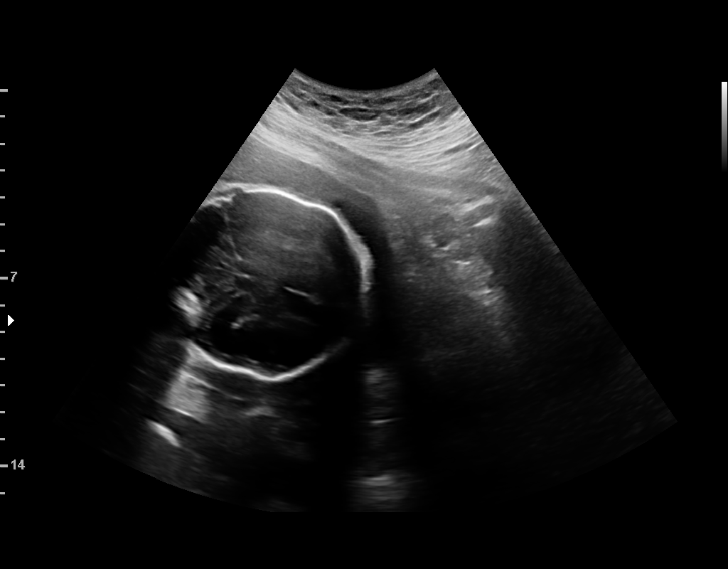
[im 7/31]
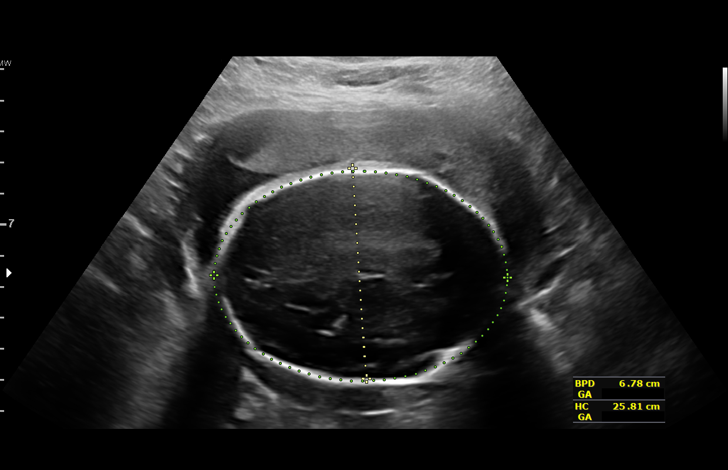
[im 11/31]
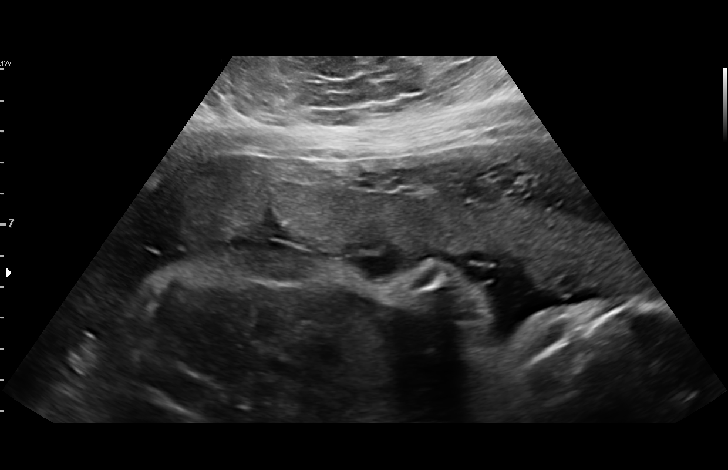
[im 16/31]
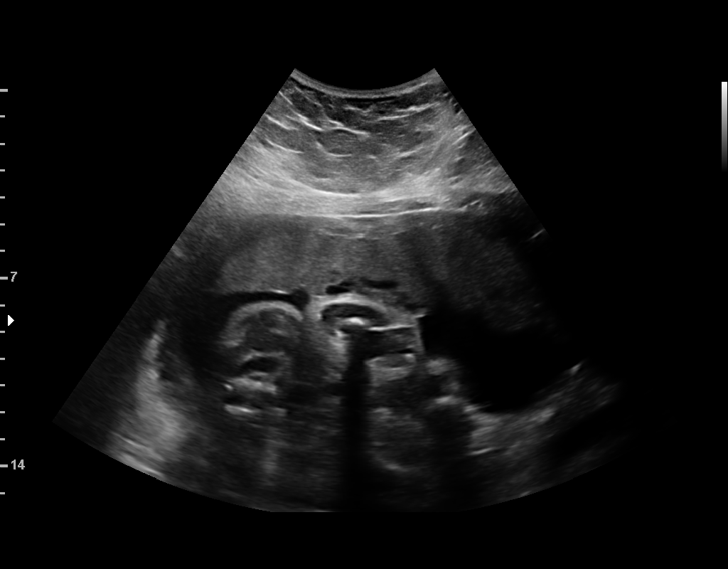
[im 20/31]
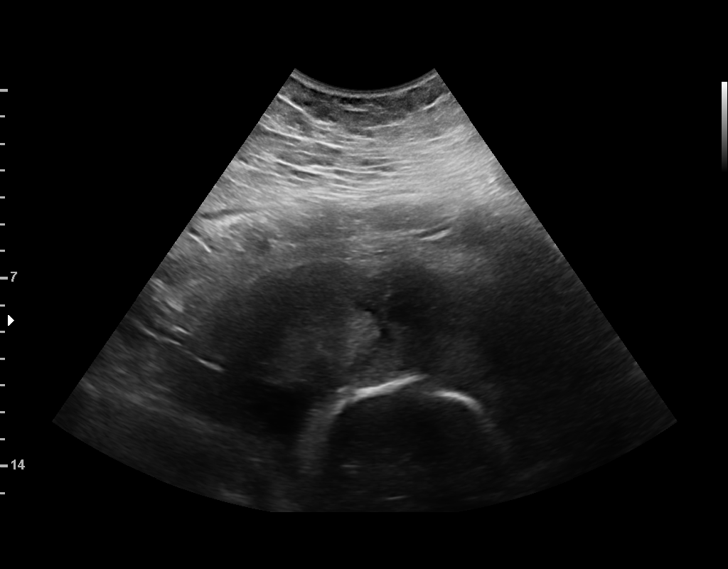
[im 24/31]
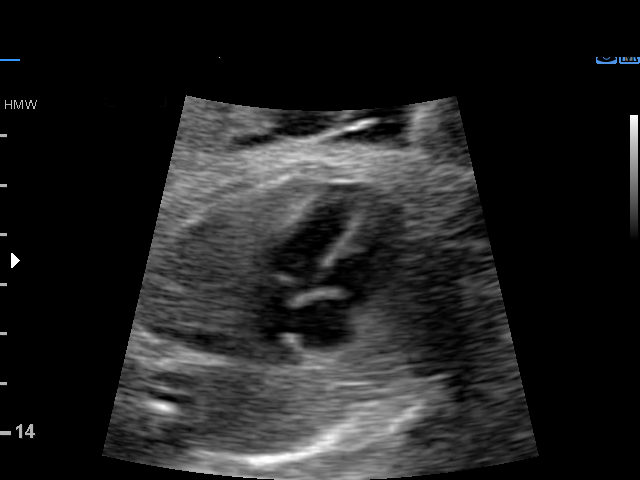
[im 31/31]
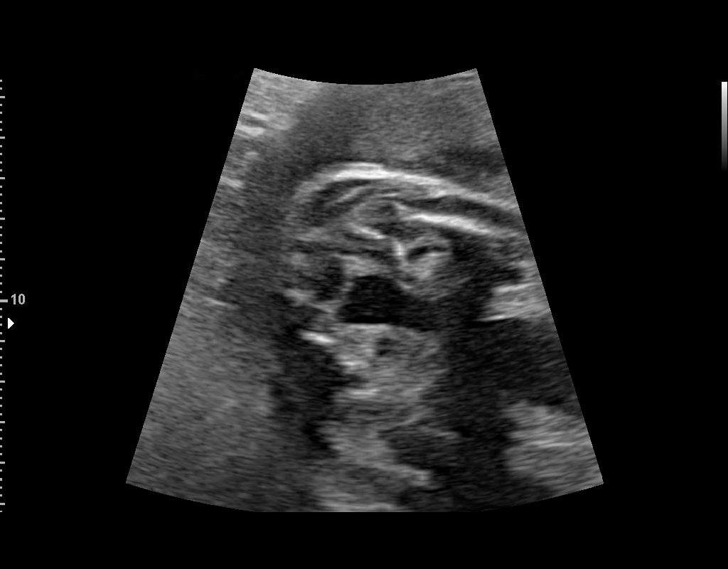

[Series 3: us mfm ob follow-up · 27 acquisitions, 6 frames shown (2 of 2)]
[im 3/27]
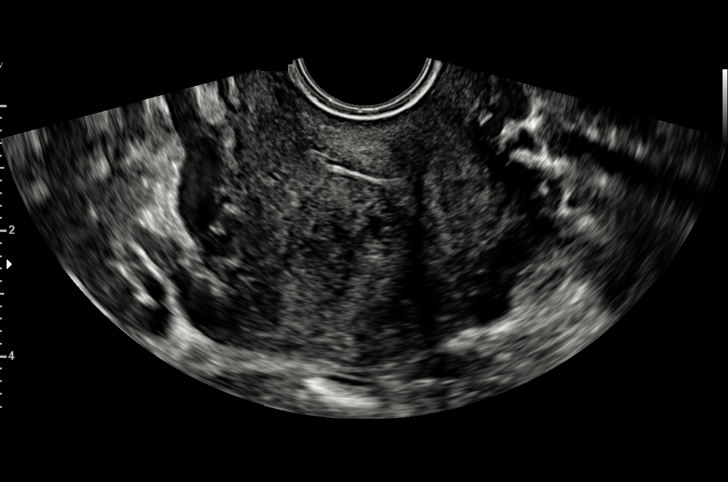
[im 7/27]
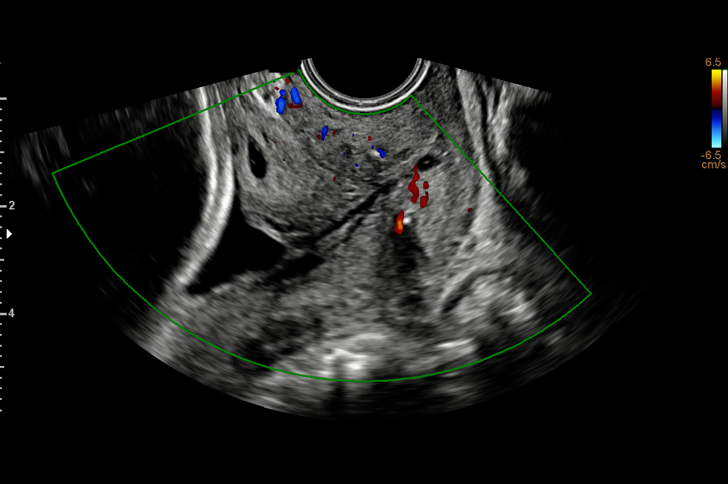
[im 11/27]
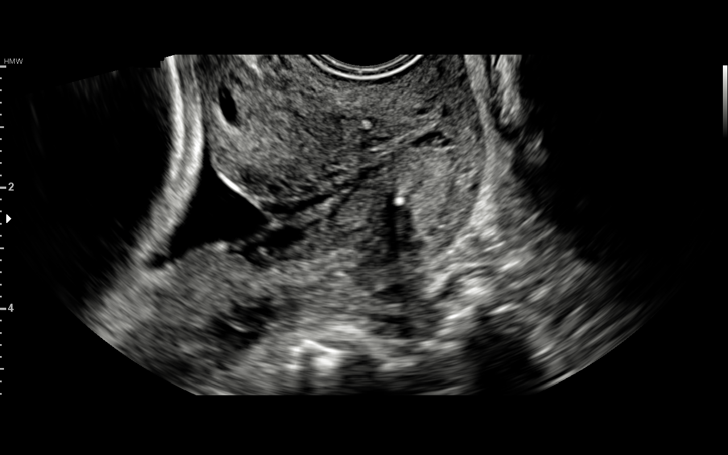
[im 16/27]
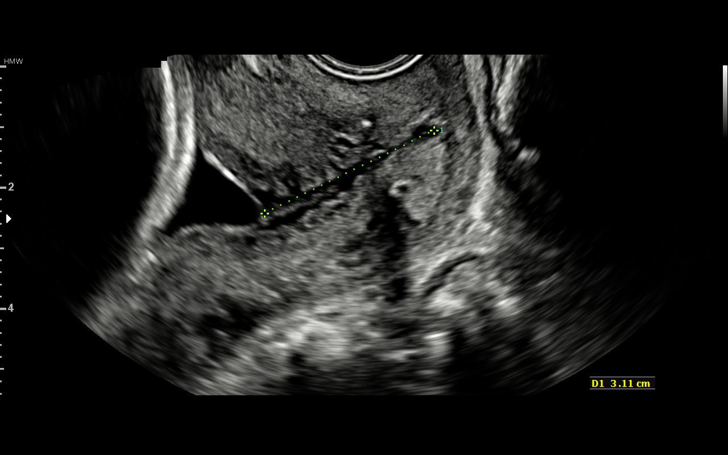
[im 20/27]
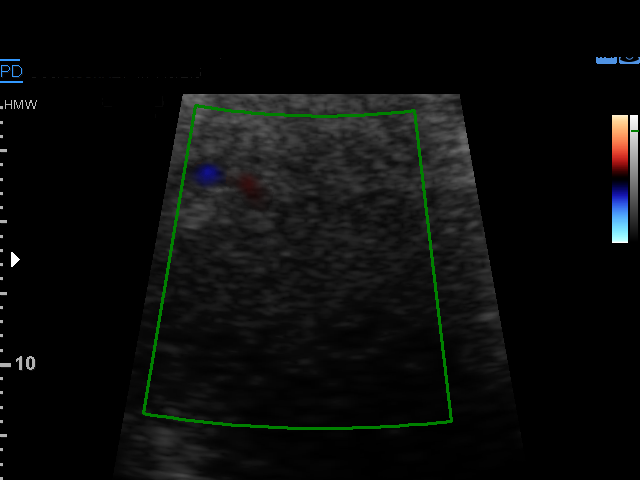
[im 24/27]
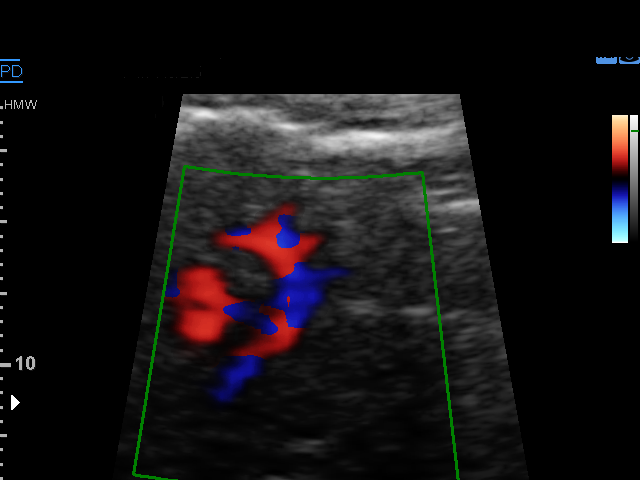

[13 of 28 positions shown; findings below may reference images not displayed]

pm)

for [REDACTED]care

Indications

27 weeks gestation of pregnancy
Encounter for cervical length
Maternal morbid obesity (pregravid BMI 46)
Cervical cerclage suture present, second
trimester (Placed 09-04-17)
Cervical incompetence, second trimester
Poor obstetric history: Previous midtrimester
loss (18 and 21 weeks)
OB History

Blood Type:            Height:  5'8"   Weight (lb):  308       BMI:
Gravidity:    5         Term:   0        Prem:   1        SAB:   2
TOP:          1       Ectopic:  0        Living: 0
Fetal Evaluation

Num Of Fetuses:     1
Fetal Heart         137
Rate(bpm):
Cardiac Activity:   Observed
Presentation:       Cephalic
Placenta:           Anterior, above cervical os
P. Cord Insertion:  Visualized, central

Amniotic Fluid
AFI FV:      Subjectively within normal limits
Biometry
BPD:        68  mm     G. Age:  27w 3d         41  %    CI:        71.15   %    70 - 86
FL/HC:      19.9   %    18.6 -
HC:      256.8  mm     G. Age:  27w 6d         42  %    HC/AC:      1.06        1.05 -
AC:      241.4  mm     G. Age:  28w 3d         76  %    FL/BPD:     75.1   %    71 - 87
FL:       51.1  mm     G. Age:  27w 2d         38  %    FL/AC:      21.2   %    20 - 24
HUM:        45  mm     G. Age:  26w 5d         32  %

Est. FW:    7766  gm      2 lb 9 oz     66  %
Gestational Age

LMP:           27w 2d        Date:  06/03/17                 EDD:   03/10/18
U/S Today:     27w 5d                                        EDD:   03/07/18
Best:          27w 2d     Det. By:  LMP  (06/03/17)          EDD:   03/10/18
Anatomy

Cranium:               Appears normal         Aortic Arch:            Not well visualized
Cavum:                 Appears normal         Ductal Arch:            Not well visualized
Ventricles:            Appears normal         Diaphragm:              Previously seen
Choroid Plexus:        Previously seen        Stomach:                Appears normal, left
sided
Cerebellum:            Previously seen        Abdomen:                Previously seen
Posterior Fossa:       Previously seen        Abdominal Wall:         Previously seen
Nuchal Fold:           Not applicable (>20    Cord Vessels:           Previously seen
wks GA)
Face:                  Orbits nl; profile not Kidneys:                Previously seen
well visualized
Lips:                  Previously seen        Bladder:                Appears normal
Thoracic:              Appears normal         Spine:                  Previously seen
Heart:                 Appears normal         Upper Extremities:      Previously seen
(4CH, axis, and situs
RVOT:                  Not well visualized    Lower Extremities:      Previously seen
LVOT:                  Appears normal

Other:  Parents do not wish to know sex of fetus. Heels previously visualized
previously. Technically difficult due to maternal habitus and fetal
position.
Cervix Uterus Adnexa

Cervix
Length:            3.4  cm.
Measured transvaginally.

Uterus
No abnormality visualized.

Left Ovary
No adnexal mass visualized.

Right Ovary
No adnexal mass visualized.

Cul De Sac:   No free fluid seen.

Adnexa:       No abnormality visualized.
Impression

SIUP at 27+2 weeks
Normal interval anatomy; anatomic survey complete except
for profile, RVOT and arches
Normal amniotic fluid volume
Appropriate interval growth with EFW at the 66th %tile
EV views of cervix: normal length without funneling; suture
visualized and appeared to be intact
Recommendations

Follow-up ultrasound for growth in 6 weeks ([place])

## 2018-12-30 ENCOUNTER — Telehealth: Payer: Self-pay

## 2018-12-30 NOTE — Telephone Encounter (Signed)
Patient called stating that she is currently on Depo Provera.  Patient states she is due for a shot on April 7th - patient states she would like to try the pill for one month due to COViD 19 and not wanting to come to the building.  Patient is still breastfeeding.   Will route to provider for approval. Armandina Stammer RN

## 2018-12-31 MED ORDER — NORETHINDRONE 0.35 MG PO TABS: 1.0000 | ORAL_TABLET | Freq: Every day | ORAL | 11 refills | Status: DC

## 2018-12-31 NOTE — Telephone Encounter (Signed)
Sent prescription for ortho micronor to pharmacy.

## 2019-01-05 ENCOUNTER — Ambulatory Visit: Payer: Medicaid Other

## 2019-01-26 ENCOUNTER — Telehealth: Payer: Self-pay

## 2019-01-26 MED ORDER — PANTOPRAZOLE SODIUM 40 MG PO TBEC: 40.0000 mg | DELAYED_RELEASE_TABLET | Freq: Every day | ORAL | 3 refills | Status: AC

## 2019-01-26 NOTE — Telephone Encounter (Signed)
Patient requesting refill on protonix due to continued acid reflux. Pharmacy verified. Armandina Stammer RN

## 2019-03-15 ENCOUNTER — Telehealth: Payer: Self-pay

## 2019-03-15 NOTE — Telephone Encounter (Signed)
Patient states that she is having trouble with weight loss. Patient states she has tried Keto, counting calories and really having a hard time. Patient states when she lived in [place] and wanted to loose 20 lbs she tried phentermine and it worked for her. She does not have a primary care doctor here and wondering if this is something Dr. Nehemiah Settle prescribes. I informed her I would ask him and let her know. Kathrene Alu RN

## 2019-03-17 NOTE — Telephone Encounter (Signed)
Patient called and told her about Dr. Dennard Nip and gave her the phone number and address for her. Kathrene Alu RN

## 2019-05-21 ENCOUNTER — Telehealth: Payer: Self-pay

## 2019-05-21 NOTE — Telephone Encounter (Signed)
Pt made aware that she can stop the Micronor and take the [place]. Understanding was voiced. Whitman Meinhardt l Abrahan Fulmore, CMA

## 2019-05-21 NOTE — Telephone Encounter (Signed)
-----   Message from Truett Mainland, DO sent at 05/21/2019  8:31 AM EDT ----- Regarding: RE: Medication question She can stop the Micronor and just take the sprintec. ----- Message ----- From: Valentina Lucks, CMA Sent: 05/18/2019   4:37 PM EDT To: Truett Mainland, DO Subject: Medication question                            Dr. Nehemiah Settle this pt is taking Phentermine and Ortho Micronor. She said she started bleeding while on the Phentermine and her weight management doctor prescribed Sprintec to take with her Ortho Micronor to stop the bleeding. The pt wants to know is it ok to start the Ortho Micronor and Sprintec at the same time?

## 2019-11-04 ENCOUNTER — Ambulatory Visit (INDEPENDENT_AMBULATORY_CARE_PROVIDER_SITE_OTHER): Payer: Medicaid Other | Admitting: Family Medicine

## 2019-11-04 ENCOUNTER — Other Ambulatory Visit: Payer: Self-pay

## 2019-11-04 ENCOUNTER — Other Ambulatory Visit (HOSPITAL_COMMUNITY)
Admission: RE | Admit: 2019-11-04 | Discharge: 2019-11-04 | Disposition: A | Discharge status: Home / Self Care | Payer: Medicaid Other | Source: Ambulatory Visit | Attending: Family Medicine | Admitting: Family Medicine

## 2019-11-04 VITALS — BP 130/88 | HR 106 | Ht 68.0 in | Wt 272.0 lb

## 2019-11-04 DIAGNOSIS — Z30430 Encounter for insertion of intrauterine contraceptive device: Secondary | ICD-10-CM | POA: Diagnosis not present

## 2019-11-04 DIAGNOSIS — Z0000 Encounter for general adult medical examination without abnormal findings: Secondary | ICD-10-CM

## 2019-11-04 DIAGNOSIS — Z01419 Encounter for gynecological examination (general) (routine) without abnormal findings: Secondary | ICD-10-CM

## 2019-11-04 DIAGNOSIS — N898 Other specified noninflammatory disorders of vagina: Secondary | ICD-10-CM

## 2019-11-04 DIAGNOSIS — G8929 Other chronic pain: Secondary | ICD-10-CM

## 2019-11-04 MED ORDER — LEVONORGESTREL 19.5 MCG/DAY IU IUD: INTRAUTERINE_SYSTEM | Freq: Once | INTRAUTERINE | Status: AC

## 2019-11-04 NOTE — Progress Notes (Signed)
GYNECOLOGY ANNUAL PREVENTATIVE CARE ENCOUNTER NOTE  Subjective:   Melanie Harmon is a 29 y.o. [phone] female here for a routine annual gynecologic exam.  Current complaints: vaginal dryness all the time, worse with sex. Is currently on trisprintec (pres[facility] by [facility], where she was going for medical weightloss). Vaginal dryness occurred since delivery and didn't matter what form of birth control she was on (depo, micronor, tri-sprintec). Also has coccygeal pain if sitting for long periods of time (greater than 20 minutes). Painful to stand, walk after sitting for that length of time. Denies abnormal vaginal bleeding, discharge, pelvic pain, problems with intercourse or other gynecologic concerns.    Gynecologic History Patient's last menstrual period was 10/31/2019. Patient is sexually active  Contraception: OCP (estrogen/progesterone) Last Pap: 2019. Results were: normal Last mammogram: n/a.  Obstetric History OB History  Gravida Para Term Preterm AB Living  5 2 1 1 3 1   SAB TAB Ectopic Multiple Live Births  2 1   0 2    # Outcome Date GA Lbr Len/2nd Weight Sex Delivery Anes PTL Lv  5 Term 02/27/18 [redacted]w[redacted]d 21:20 / 00:42 9 lb 8 oz (4.31 kg) M Vag-Spont Local  LIV  4 Preterm 11/29/16 [redacted]w[redacted]d  14.1 oz (0.4 kg) F Vag-Spont None  ND  3 SAB  [redacted]w[redacted]d         2 SAB           1 TAB             Past Medical History:  Diagnosis Date  . Fetal demise before 22 weeks with retention of dead fetus    x 2 - one at 18 wks 01/2016, one at 22 wks 11/2016  . GERD (gastroesophageal reflux disease)    with pregnancy only  . Headache    with pregnancy only - otc tylenol med prn  . Missed abortion    x 2 - TAB and MAB (D&C)    Past Surgical History:  Procedure Laterality Date  . CERVICAL CERCLAGE N/A 09/04/2017   Procedure: CERCLAGE CERVICAL;  Surgeon: 14/02/2017, MD;  Location: [facility];  Service: Gynecology;  Laterality: N/A;  . DILATION AND CURETTAGE OF UTERUS     MAB  .  Dilation and Curretage    . WISDOM TOOTH EXTRACTION      Current Outpatient Medications on File Prior to Visit  Medication Sig Dispense Refill  . Norgestimate-Ethinyl Estradiol Triphasic (TRI-SPRINTEC) 0.18/0.215/0.25 MG-35 MCG tablet Take 1 tablet by mouth daily.    . pantoprazole (PROTONIX) 40 MG tablet Take 1 tablet (40 mg total) by mouth daily. 30 tablet 3  . amLODipine (NORVASC) 10 MG tablet Take 1 tablet (10 mg total) by mouth daily. 30 tablet 0  . medroxyPROGESTERone (DEPO-PROVERA) 150 MG/ML injection Inject 1 mL (150 mg total) into the muscle every 3 (three) months. 1 mL 0  . norethindrone (ORTHO MICRONOR) 0.35 MG tablet Take 1 tablet (0.35 mg total) by mouth daily. (Patient not taking: Reported on 11/04/2019) 1 Package 11  . Prenatal Vit-Fe Fumarate-FA (MULTIVITAMIN-PRENATAL) 27-0.8 MG TABS tablet Take 1 tablet by mouth daily at 12 noon.     Current Facility-Administered Medications on File Prior to Visit  Medication Dose Route Frequency Provider Last Rate Last Admin  . medroxyPROGESTERone (DEPO-PROVERA) injection 150 mg  150 mg Intramuscular Q90 days 01/02/2020, DO   150 mg at 10/12/18 1329    No Known Allergies  Social History   Socioeconomic History  .  Marital status: Single    Spouse name: Not on file  . Number of children: Not on file  . Years of education: Not on file  . Highest education level: Not on file  Occupational History  . Not on file  Tobacco Use  . Smoking status: Former Smoker    Packs/day: 1.00    Years: 7.00    Pack years: 7.00    Types: Cigarettes    Quit date: 07/31/2017    Years since quitting: 2.2  . Smokeless tobacco: Never Used  Substance and Sexual Activity  . Alcohol use: No  . Drug use: No  . Sexual activity: Not Currently    Birth control/protection: None  Other Topics Concern  . Not on file  Social History Narrative  . Not on file   Social Determinants of Health   Financial Resource Strain:   . Difficulty of Paying Living  Expenses: Not on file  Food Insecurity:   . Worried About Charity fundraiser in the Last Year: Not on file  . Ran Out of Food in the Last Year: Not on file  Transportation Needs:   . Lack of Transportation (Medical): Not on file  . Lack of Transportation (Non-Medical): Not on file  Physical Activity:   . Days of Exercise per Week: Not on file  . Minutes of Exercise per Session: Not on file  Stress:   . Feeling of Stress : Not on file  Social Connections:   . Frequency of Communication with Friends and Family: Not on file  . Frequency of Social Gatherings with Friends and Family: Not on file  . Attends Religious Services: Not on file  . Active Member of Clubs or Organizations: Not on file  . Attends Archivist Meetings: Not on file  . Marital Status: Not on file  Intimate Partner Violence:   . Fear of Current or Ex-Partner: Not on file  . Emotionally Abused: Not on file  . Physically Abused: Not on file  . Sexually Abused: Not on file    Family History  Problem Relation Age of Onset  . Heart attack Maternal Grandmother   . Heart attack Maternal Grandfather     The following portions of the patient's history were reviewed and updated as appropriate: allergies, current medications, past family history, past medical history, past social history, past surgical history and problem list.  Review of Systems Pertinent items are noted in HPI.   Objective:  BP 130/88   Pulse (!) 106   Ht 5\' 8"  (1.727 m)   Wt 272 lb (123.4 kg)   LMP 10/31/2019   BMI 41.36 kg/m  Wt Readings from Last 3 Encounters:  11/04/19 272 lb (123.4 kg)  10/12/18 (!) 308 lb (139.7 kg)  08/06/18 (!) 310 lb (140.6 kg)     Chaperone present during exam  CONSTITUTIONAL: Well-developed, well-nourished female in no acute distress.  HENT:  Normocephalic, atraumatic, External right and left ear normal. Oropharynx is clear and moist EYES: Conjunctivae and EOM are normal. Pupils are equal, round, and  reactive to light. No scleral icterus.  NECK: Normal range of motion, supple, no masses.  Normal thyroid.   CARDIOVASCULAR: Normal heart rate noted, regular rhythm RESPIRATORY: Clear to auscultation bilaterally. Effort and breath sounds normal, no problems with respiration noted. BREASTS: Symmetric in size. No masses, skin changes, nipple drainage, or lymphadenopathy. ABDOMEN: Soft, normal bowel sounds, no distention noted.  No tenderness, rebound or guarding.  PELVIC: Normal appearing external genitalia;  normal appearing vaginal mucosa and cervix.  No abnormal discharge noted.  Normal uterine size, no other palpable masses, no uterine or adnexal tenderness. MUSCULOSKELETAL: Normal range of motion. No tenderness.  No cyanosis, clubbing, or edema.  2+ distal pulses. SKIN: Skin is warm and dry. No rash noted. Not diaphoretic. No erythema. No pallor. NEUROLOGIC: Alert and oriented to person, place, and time. Normal reflexes, muscle tone coordination. No cranial nerve deficit noted. PSYCHIATRIC: Normal mood and affect. Normal behavior. Normal judgment and thought content.  IUD Procedure Note Patient identified, informed consent performed, signed copy in chart, time out was performed.  Urine pregnancy test negative.  Speculum placed in the vagina.  Cervix visualized.  Cleaned with Betadine x 2.  Grasped anteriorly with a single tooth tenaculum.  Uterus sounded to 8 cm.  Liletta  IUD placed per manufacturer's recommendations.  Strings trimmed to 3 cm. Tenaculum was removed, good hemostasis noted.  Patient tolerated procedure well.   Patient given post procedure instructions and Liletta care card with expiration date.  Patient is asked to check IUD strings periodically and follow up in 4-6 weeks for IUD check.   Assessment:  Annual gynecologic examination with pap smear   Plan:  1. Well Woman Exam Will follow up results of pap smear and manage accordingly. - Cytology - PAP( [place])  2.  Coccygeal pain, chronic - DG Sacrum/Coccyx; Future - Ambulatory referral to Physical Therapy  3. Encounter for IUD insertion liletta inserted without difficulty  4. Vaginal dryness WIll see if limiting hormone exposure is helpful.   Routine preventative health maintenance measures emphasized. Please refer to After Visit Summary for other counseling recommendations.    Candelaria Celeste, DO [facility]

## 2019-11-04 NOTE — Progress Notes (Signed)
Here today for annual.  Wants to discuss vaginal dryness, tailbone pain since delivery, birth control, heavy cycle in December, passed something vaginally 10/10/19 (has it with her).

## 2019-11-04 NOTE — Addendum Note (Signed)
Addended by: Pennie Banter on: 11/04/2019 04:56 PM   Modules accepted: Orders

## 2019-11-08 LAB — CYTOLOGY - PAP
Comment: NEGATIVE
Diagnosis: NEGATIVE
High risk HPV: NEGATIVE

## 2019-11-11 ENCOUNTER — Other Ambulatory Visit: Payer: Self-pay

## 2019-11-11 ENCOUNTER — Ambulatory Visit: Payer: Medicaid Other | Attending: Family Medicine | Admitting: Physical Therapy

## 2019-11-11 ENCOUNTER — Encounter: Payer: Self-pay | Admitting: Physical Therapy

## 2019-11-11 DIAGNOSIS — M62838 Other muscle spasm: Secondary | ICD-10-CM | POA: Insufficient documentation

## 2019-11-11 DIAGNOSIS — M533 Sacrococcygeal disorders, not elsewhere classified: Secondary | ICD-10-CM | POA: Insufficient documentation

## 2019-11-11 NOTE — Therapy (Signed)
[facility] [address]  [address] [place], [place], [address] Phone: [phone]   Fax:  [phone]  Physical Therapy Evaluation  Patient Details  Name: Tariah Transue MRN: [id number] Date of Birth: 1991/08/21 Referring Provider (PT): Candelaria Celeste, DO    Encounter Date: 11/11/2019  PT End of Session - 11/11/19 1715    Visit Number  1    Number of Visits  4    Date for PT Re-Evaluation  12/09/19    Authorization Type  Medicaid    PT Start Time  1617    PT Stop Time  1657    PT Time Calculation (min)  40 min    Activity Tolerance  Patient tolerated treatment well    Behavior During Therapy  [facility] for tasks assessed/performed       Past Medical History:  Diagnosis Date  . Fetal demise before 22 weeks with retention of dead fetus    x 2 - one at 18 wks 01/2016, one at 22 wks 11/2016  . GERD (gastroesophageal reflux disease)    with pregnancy only  . Headache    with pregnancy only - otc tylenol med prn  . Missed abortion    x 2 - TAB and MAB (D&C)    Past Surgical History:  Procedure Laterality Date  . CERVICAL CERCLAGE N/A 09/04/2017   Procedure: CERCLAGE CERVICAL;  Surgeon: Willodean Rosenthal, MD;  Location: [facility];  Service: Gynecology;  Laterality: N/A;  . DILATION AND CURETTAGE OF UTERUS     MAB  . Dilation and Curretage    . WISDOM TOOTH EXTRACTION      There were no vitals filed for this visit.   Subjective Assessment - 11/11/19 1618    Subjective  Patient reports that since giving birth on 02/27/2018, she experiences pain in her tailbone with sitting. Did not experience this pain during pregnancy but did have sciatic pain down her R side which resolved after delivery. Pain is worse with prolonged sitting and it is especially difficult for to stand or get out of the car after prolonged periods of sitting. Has tried sitting on a donut with no improvement. No change in pain levels with the surface she sits on.  Better with rest. Pain Intermittently radiates across buttock. Denies N/T. No saddle anesthesia.    Pertinent History  HA, GERD    Limitations  Sitting    How long can you sit comfortably?  15-20    How long can you stand comfortably?  unlimited    How long can you walk comfortably?  unlimited    Diagnostic tests  none    Patient Stated Goals  to not have pain    Currently in Pain?  Yes    Pain Score  0-No pain    Pain Location  Coccyx    Pain Descriptors / Indicators  Sharp;Dull    Pain Type  Chronic pain         [facility] PT Assessment - 11/11/19 1624      Assessment   Medical Diagnosis  Chronic coccyx pain     Referring Provider (PT)  Candelaria Celeste, DO    Onset Date/Surgical Date  02/27/18    Next MD Visit  12/02/19    Prior Therapy  no      Precautions   Precautions  None      Balance Screen   Has the patient fallen in the past 6 months  No  Has the patient had a decrease in activity level because of a fear of falling?   No    Is the patient reluctant to leave their home because of a fear of falling?   No      Home Social worker  Private residence    Living Arrangements  Spouse/significant other;Children    Available Help at Discharge  Family    Type of [place]      Prior Function   Level of [place]  --   homemaker   Leisure  none      Cognition   Overall Cognitive Status  Within Functional Limits for tasks assessed      Sensation   Light Touch  Appears Intact      Coordination   Gross Motor Movements are Fluid and Coordinated  Yes      Posture/Postural Control   Posture/Postural Control  Postural limitations    Postural Limitations  Rounded Shoulders      ROM / Strength   AROM / PROM / Strength  Strength;AROM      AROM   AROM Assessment Site  Lumbar    Lumbar Flexion  toes    Lumbar Extension  WNL    Lumbar - Right Side Bend  jt line    Lumbar - Left Side Bend  jt line    Lumbar - Right  Rotation  WNL    Lumbar - Left Rotation  WNL      Strength   Strength Assessment Site  Hip;Knee;Ankle    Right/Left Hip  Right;Left    Right Hip Flexion  4/5    Right Hip Extension  4+/5    Right Hip External Rotation   4+/5    Right Hip Internal Rotation  4+/5    Right Hip ABduction  4+/5    Right Hip ADduction  4+/5    Left Hip Flexion  4/5    Left Hip Extension  4/5    Left Hip External Rotation  4/5    Left Hip Internal Rotation  4/5    Left Hip ABduction  4+/5    Left Hip ADduction  4+/5    Right/Left Knee  Right;Left    Right Knee Flexion  4+/5    Right Knee Extension  4+/5    Left Knee Flexion  4+/5    Left Knee Extension  5/5    Right/Left Ankle  Right;Left    Right Ankle Dorsiflexion  4+/5    Right Ankle Plantar Flexion  4+/5    Left Ankle Dorsiflexion  4+/5    Left Ankle Plantar Flexion  4+/5      Flexibility   Soft Tissue Assessment /Muscle Length  yes    Hamstrings  B WNL    Quadriceps  B WNL    Piriformis  L piriformis slightly tight with KTOS      Palpation   Spinal mobility  mild pain with pressure over sacrum and coccyx    SI assessment   ASIS and PSIS symmetrical    Palpation comment  tightness in B proximal and lateral glutes and L piriformis; TTP in L piriformis      Ambulation/Gait   Gait Pattern  Step-through pattern;Within Functional Limits    Ambulation Surface  Level;Indoor    Gait velocity  normal                Objective measurements completed on examination:  See above findings.              PT Education - 11/11/19 1714    Education Details  prognosis, POC, HEP; edu on benefits of pelvic floor PT    Person(s) Educated  Patient    Methods  Explanation;Demonstration;Tactile cues;Verbal cues;Handout    Comprehension  Verbalized understanding;Returned demonstration       PT Short Term Goals - 11/11/19 1720      PT SHORT TERM GOAL #1   Title  Patient to be independent with initial HEP.    Time  2    Period  Weeks     Status  New    Target Date  11/25/19        PT Long Term Goals - 11/11/19 1720      PT LONG TERM GOAL #1   Title  Patient to be independent with advanced HEP.    Time  4    Period  Weeks    Status  New    Target Date  12/09/19      PT LONG TERM GOAL #2   Title  Patient to demonstrate B hip strength >/=4+/5.    Time  6    Period  Weeks    Status  New    Target Date  12/09/19      PT LONG TERM GOAL #3   Title  Patient to demonstrate no tightness or soft tissue restriction in B piriformis or glutes.    Time  4    Period  Weeks    Status  New    Target Date  12/09/19      PT LONG TERM GOAL #4   Title  Patient to report 1.5 hours of sitting without pain limiting.    Time  4    Period  Weeks    Status  New    Target Date  12/09/19             Plan - 11/11/19 1715    Clinical Impression Statement  Patient is a 29y/o F presenting to OPPT with c/o chronic coccyx pain since giving birth on 02/27/18. Pain is worse with prolonged sitting and it is especially difficult for to stand or get out of the car after these periods. Did have sciatic-type pain down R LE during pregnancy which resolved after delivery, but coccyx pain has continued. Denies N/T. No saddle anesthesia. Patient today presenting with normal and nonpainful lumbar AROM, mild hip weakness L>R, L piriformis tightness. Patient's pain reproduced with gentle pressure over sacrum and coccyx. Patient was TTP over L piriformis and with palpable tightness in B proximal glutes as well. Patient educated on gentle stretching and hip strengthening HEP- patient reported understanding. Educated patient on possible benefit from pelvic floor PT for further examination of her concerns. Patient agreeable. Would benefit from skilled PT services 1x/week for 3 weeks to address aforementioned impairments.    Personal Factors and Comorbidities  Age;Comorbidity 2;Fitness;Past/Current Experience;Sex;Time since onset of  injury/illness/exacerbation    Comorbidities  HA, GERD    Examination-Activity Limitations  Sit;Transfers    Examination-Participation Restrictions  Church;Driving    Stability/Clinical Decision Making  Stable/Uncomplicated    Clinical Decision Making  Low    Rehab Potential  Good    PT Frequency  1x / week    PT Duration  3 weeks    PT Treatment/Interventions  ADLs/Self Care Home Management;Cryotherapy;Electrical Stimulation;Moist Heat;Traction;Therapeutic exercise;Therapeutic activities;Functional mobility training;Stair training;Gait training;Ultrasound;Neuromuscular re-education;Patient/family education;Manual techniques;Taping;Energy conservation;Dry needling;Passive  range of motion    PT Next Visit Plan  further evaluation for cause of patient's pain; review of HEP    Consulted and Agree with Plan of Care  Patient       Patient will benefit from skilled therapeutic intervention in order to improve the following deficits and impairments:  Decreased activity tolerance, Decreased strength, Pain, Increased fascial restricitons, Increased muscle spasms, Improper body mechanics, Decreased range of motion, Postural dysfunction, Impaired flexibility  Visit Diagnosis: Sacrococcygeal disorders, not elsewhere classified  Other muscle spasm     Problem List Patient Active Problem List   Diagnosis Date Noted  . Morbid obesity (HCC) 07/18/2017  . History of cervical incompetence in pregnancy, currently pregnant, unspecified trimester 11/25/2016  . Headaches due to old head injury 10/23/2016     Anette Guarneri, PT, DPT 11/11/19 5:24 PM   [facility] [address]  [address] [place], [place], [address] Phone: [phone]   Fax:  [phone]  Name: Manasa Spease MRN: [id number] Date of Birth: 04-03-1991

## 2019-12-02 ENCOUNTER — Ambulatory Visit (INDEPENDENT_AMBULATORY_CARE_PROVIDER_SITE_OTHER): Payer: Medicaid Other | Admitting: Family Medicine

## 2019-12-02 ENCOUNTER — Encounter: Payer: Self-pay | Admitting: Family Medicine

## 2019-12-02 ENCOUNTER — Other Ambulatory Visit: Payer: Self-pay

## 2019-12-02 VITALS — BP 109/83 | HR 96 | Ht 68.0 in | Wt 271.0 lb

## 2019-12-02 DIAGNOSIS — Z30431 Encounter for routine checking of intrauterine contraceptive device: Secondary | ICD-10-CM | POA: Diagnosis not present

## 2019-12-02 NOTE — Progress Notes (Signed)
   Subjective:   Patient Name: Melanie Harmon, female   DOB: 01/01/1991, 29 y.o.  MRN: [id number]  HPI Patient here for an IUD check.  She had the Liletta IUD placed 1 month ago.  She reports no problems.   Review of Systems  Constitutional: Negative for fever and chills.  Gastrointestinal: Negative for abdominal pain.  Genitourinary: Negative for vaginal discharge, vaginal pain, pelvic pain and dyspareunia.        Objective:   Physical Exam  Constitutional: She appears well-developed and well-nourished.  HENT:  Head: Normocephalic and atraumatic.  Abdominal: Soft. There is no tenderness. There is no guarding.  Genitourinary: There is no rash, tenderness or lesion on the right labia. There is no rash, tenderness or lesion on the left labia. No erythema or tenderness in the vagina. No foreign body around the vagina. No signs of injury around the vagina. No vaginal discharge found.    Skin: Skin is warm and dry.  Psychiatric: She has a normal mood and affect. Her behavior is normal. Judgment and thought content normal.       Assessment & Plan:  1. IUD check up IUD in place. Vaginal dryness improved. Pt to call with any other problems.  Recheck in 1 year.
